# Patient Record
Sex: Female | Born: 1985
Health system: Southern US, Community
[De-identification: ages and names within clinical notes are randomized; demographics above are authoritative.]

## PROBLEM LIST (undated history)

## (undated) ENCOUNTER — Inpatient Hospital Stay (HOSPITAL_COMMUNITY): Payer: Self-pay

## (undated) DIAGNOSIS — N809 Endometriosis, unspecified: Secondary | ICD-10-CM

## (undated) DIAGNOSIS — N83209 Unspecified ovarian cyst, unspecified side: Secondary | ICD-10-CM

## (undated) DIAGNOSIS — O139 Gestational [pregnancy-induced] hypertension without significant proteinuria, unspecified trimester: Secondary | ICD-10-CM

## (undated) DIAGNOSIS — T8859XA Other complications of anesthesia, initial encounter: Secondary | ICD-10-CM

## (undated) HISTORY — DX: Unspecified ovarian cyst, unspecified side: N83.209

## (undated) HISTORY — PX: ABDOMINAL HYSTERECTOMY: SHX81

## (undated) HISTORY — DX: Endometriosis, unspecified: N80.9

---
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
## 2006-02-16 HISTORY — PX: LAPAROSCOPY: SHX197

## 2009-02-16 DIAGNOSIS — O139 Gestational [pregnancy-induced] hypertension without significant proteinuria, unspecified trimester: Secondary | ICD-10-CM

## 2009-02-16 HISTORY — DX: Gestational (pregnancy-induced) hypertension without significant proteinuria, unspecified trimester: O13.9

## 2013-02-16 NOTE — L&D Delivery Note (Signed)
Delivery Note At 3:20 PM a viable female was delivered via NSVD.  APGAR: and weight  .  pending Placenta status: Intact, Spontaneous.  Cord: 3 vessel with the following complications: none  Anesthesia:  epidural Episiotomy: none  Lacerations:  none Suture Repair: n/a Est. Blood Loss (mL):    Mom to postpartum.  Baby to Couplet care / Skin to Skin.  April Bolton C. 02/09/2014, 3:33 PM

## 2013-04-12 ENCOUNTER — Encounter: Payer: Self-pay | Admitting: Advanced Practice Midwife

## 2013-05-16 ENCOUNTER — Encounter: Payer: Self-pay | Admitting: Obstetrics & Gynecology

## 2013-05-16 ENCOUNTER — Ambulatory Visit (INDEPENDENT_AMBULATORY_CARE_PROVIDER_SITE_OTHER): Payer: 59 | Admitting: Obstetrics & Gynecology

## 2013-05-16 VITALS — BP 111/76 | HR 81 | Resp 16 | Ht 62.0 in | Wt 124.0 lb

## 2013-05-16 DIAGNOSIS — Z01419 Encounter for gynecological examination (general) (routine) without abnormal findings: Secondary | ICD-10-CM

## 2013-05-16 DIAGNOSIS — N809 Endometriosis, unspecified: Secondary | ICD-10-CM | POA: Insufficient documentation

## 2013-05-16 DIAGNOSIS — Z9889 Other specified postprocedural states: Secondary | ICD-10-CM

## 2013-05-16 DIAGNOSIS — Z1151 Encounter for screening for human papillomavirus (HPV): Secondary | ICD-10-CM

## 2013-05-16 DIAGNOSIS — Z8759 Personal history of other complications of pregnancy, childbirth and the puerperium: Secondary | ICD-10-CM | POA: Insufficient documentation

## 2013-05-16 DIAGNOSIS — Z124 Encounter for screening for malignant neoplasm of cervix: Secondary | ICD-10-CM

## 2013-05-16 NOTE — Progress Notes (Signed)
  Subjective:     April CoasterStephanie Blum is a 28 y.o. female here for a routine exam.  Current complaints: had a heavy mense last month.  Was trying to become pregnant but was told her pregnancy test was negative.  Personal health questionnaire reviewed: yes.   Gynecologic History Patient's last menstrual period was 03/14/2013. Contraception: none Last Pap: 2013. Results were: normal  Obstetric History OB History  Gravida Para Term Preterm AB SAB TAB Ectopic Multiple Living  1 1 1       1     # Outcome Date GA Lbr Len/2nd Weight Sex Delivery Anes PTL Lv  1 TRM 11/07/09 10133w0d            Comments: h/o ptl       The following portions of the patient's history were reviewed and updated as appropriate: allergies, current medications, past family history, past medical history, past social history, past surgical history and problem list.  Review of Systems Pertinent items are noted in HPI.    Objective:      Filed Vitals:   05/16/13 1448  BP: 111/76  Pulse: 81  Resp: 16  Height: 5\' 2"  (1.575 m)  Weight: 124 lb (56.246 kg)   Vitals:  WNL General appearance: alert, cooperative and no distress Head: Normocephalic, without obvious abnormality, atraumatic Eyes: negative Throat: lips, mucosa, and tongue normal; teeth and gums normal Lungs: clear to auscultation bilaterally Breasts: normal appearance, no masses or tenderness, No nipple retraction or dimpling, No nipple discharge or bleeding Heart: regular rate and rhythm Abdomen: soft, non-tender; bowel sounds normal; no masses,  no organomegaly  Pelvic:  External Genitalia:  Tanner V, no lesion Urethra:  No prolpase Vagina:  Pink, normal rugae, no blood or discharge Cervix:  No CMT, no lesion Uterus:  Normal size and contour, non tender Adnexa:  Normal, no masses, non tender  Extremities: no edema, redness or tenderness in the calves or thighs Skin: no lesions or rash Lymph nodes: Axillary adenopathy: none        Assessment:     Healthy female exam.    Plan:    Education reviewed: self breast exams and skin cancer screening. Contraception: none. Follow up in: 1 year. Prenatal vitamins Pap done.  Next pap is in 3 years.

## 2013-06-27 ENCOUNTER — Ambulatory Visit (INDEPENDENT_AMBULATORY_CARE_PROVIDER_SITE_OTHER): Payer: 59 | Admitting: Obstetrics & Gynecology

## 2013-06-27 ENCOUNTER — Encounter: Payer: Self-pay | Admitting: Obstetrics & Gynecology

## 2013-06-27 VITALS — BP 117/67 | HR 92 | Wt 126.0 lb

## 2013-06-27 DIAGNOSIS — Z3480 Encounter for supervision of other normal pregnancy, unspecified trimester: Secondary | ICD-10-CM

## 2013-06-27 NOTE — Progress Notes (Signed)
Initial prenatal visit, 2nd pregnancy, last PAP 04/2013 WNL. Pt has Hx of Preterm labor, pt started Procardia bid - PIH ypertension with last pregnancy and was induced at 39 weeks - Bedside u/s shows IUP 10.603mm GA 1870w1d with FHR 132bpm

## 2013-06-27 NOTE — Progress Notes (Signed)
   Subjective:    April CoasterStephanie Bolton is a G2P1001 7090w1d being seen today for her first obstetrical visit.  Her obstetrical history is significant for none. Patient does intend to breast feed. She breastfed her son for about 10 months.  Pregnancy history fully reviewed.  Patient reports nausea.  Filed Vitals:   06/27/13 1500  BP: 117/67  Pulse: 92  Weight: 126 lb (57.153 kg)    HISTORY: OB History  Gravida Para Term Preterm AB SAB TAB Ectopic Multiple Living  2 1 1       1     # Outcome Date GA Lbr Len/2nd Weight Sex Delivery Anes PTL Lv  2 CUR           1 TRM 11/07/09 1854w0d            Comments: h/o ptl     Past Medical History  Diagnosis Date  . Endometriosis   . Ovarian cyst   . Preterm labor    Past Surgical History  Procedure Laterality Date  . Laparoscopy  2008  . Appendectomy  2005  . Hernia repair  2005    inguinal  . Brachial cleft cyst  2006  . Wisdom tooth extraction  2006   Family History  Problem Relation Age of Onset  . Diabetes Father   . Heart disease Father   . Hypertension Father   . Cancer Mother     cervical     Exam    Uterus:     Pelvic Exam:    Perineum: No Hemorrhoids   Vulva: normal   Vagina:  normal mucosa   pH:    Cervix: anteverted   Adnexa: normal adnexa   Bony Pelvis: android  System: Breast:  normal appearance, no masses or tenderness   Skin: normal coloration and turgor, no rashes    Neurologic: oriented   Extremities: normal strength, tone, and muscle mass   HEENT PERRLA   Mouth/Teeth mucous membranes moist, pharynx normal without lesions   Neck supple   Cardiovascular: regular rate and rhythm   Respiratory:  appears well, vitals normal, no respiratory distress, acyanotic, normal RR, ear and throat exam is normal, neck free of mass or lymphadenopathy, chest clear, no wheezing, crepitations, rhonchi, normal symmetric air entry   Abdomen: soft, non-tender; bowel sounds normal; no masses,  no organomegaly   Urinary:  urethral meatus normal      Assessment:    Pregnancy: G2P1001 Patient Active Problem List   Diagnosis Date Noted  . Supervision of other normal pregnancy 06/27/2013  . Endometriosis 05/16/2013  . History of vacuum extraction assisted delivery 05/16/2013        Plan:     Initial labs drawn. Prenatal vitamins. Problem list reviewed and updated. Genetic Screening discussed Quad Screen: requested.  Ultrasound discussed; fetal survey: requested.  Follow up in 4 weeks. I recommended Vit B6 and Unisom prn.   Allie BossierMyra C Karcyn Menn 06/27/2013

## 2013-06-28 LAB — OBSTETRIC PANEL
Antibody Screen: NEGATIVE
Basophils Absolute: 0 10*3/uL (ref 0.0–0.1)
Basophils Relative: 0 % (ref 0–1)
EOS ABS: 0.2 10*3/uL (ref 0.0–0.7)
Eosinophils Relative: 2 % (ref 0–5)
HCT: 38.3 % (ref 36.0–46.0)
Hemoglobin: 12.7 g/dL (ref 12.0–15.0)
Hepatitis B Surface Ag: NEGATIVE
Lymphocytes Relative: 16 % (ref 12–46)
Lymphs Abs: 1.4 10*3/uL (ref 0.7–4.0)
MCH: 29.3 pg (ref 26.0–34.0)
MCHC: 33.2 g/dL (ref 30.0–36.0)
MCV: 88.5 fL (ref 78.0–100.0)
Monocytes Absolute: 0.6 10*3/uL (ref 0.1–1.0)
Monocytes Relative: 7 % (ref 3–12)
Neutro Abs: 6.7 10*3/uL (ref 1.7–7.7)
Neutrophils Relative %: 75 % (ref 43–77)
PLATELETS: 262 10*3/uL (ref 150–400)
RBC: 4.33 MIL/uL (ref 3.87–5.11)
RDW: 13.4 % (ref 11.5–15.5)
Rh Type: POSITIVE
Rubella: 0.85 Index (ref <0.90)
WBC: 8.9 10*3/uL (ref 4.0–10.5)

## 2013-06-28 LAB — GC/CHLAMYDIA PROBE AMP
CT PROBE, AMP APTIMA: NEGATIVE
GC PROBE AMP APTIMA: NEGATIVE

## 2013-06-28 LAB — HIV ANTIBODY (ROUTINE TESTING W REFLEX): HIV 1&2 Ab, 4th Generation: NONREACTIVE

## 2013-06-30 LAB — CULTURE, OB URINE: Colony Count: >=100000

## 2013-07-24 ENCOUNTER — Encounter: Payer: Self-pay | Admitting: Advanced Practice Midwife

## 2013-07-24 ENCOUNTER — Ambulatory Visit (INDEPENDENT_AMBULATORY_CARE_PROVIDER_SITE_OTHER): Payer: 59 | Admitting: Advanced Practice Midwife

## 2013-07-24 VITALS — BP 122/73 | HR 88 | Wt 127.0 lb

## 2013-07-24 DIAGNOSIS — Z3480 Encounter for supervision of other normal pregnancy, unspecified trimester: Secondary | ICD-10-CM

## 2013-07-24 NOTE — Progress Notes (Signed)
Doing well. Denies vaginal bleeding, LOF, cramping/contractions.  Nausea improved since last visit, not taking medications at this time.  Anticipatory guidance given about next prenatal visits/ultrasounds.

## 2013-08-07 ENCOUNTER — Inpatient Hospital Stay (HOSPITAL_COMMUNITY)
Admission: AD | Admit: 2013-08-07 | Discharge: 2013-08-07 | Disposition: A | Discharge status: Home / Self Care | Payer: 59 | Source: Ambulatory Visit | Attending: Obstetrics & Gynecology | Admitting: Obstetrics & Gynecology

## 2013-08-07 ENCOUNTER — Encounter (HOSPITAL_COMMUNITY): Payer: Self-pay | Admitting: *Deleted

## 2013-08-07 DIAGNOSIS — O210 Mild hyperemesis gravidarum: Secondary | ICD-10-CM | POA: Insufficient documentation

## 2013-08-07 DIAGNOSIS — O219 Vomiting of pregnancy, unspecified: Secondary | ICD-10-CM

## 2013-08-07 DIAGNOSIS — Z833 Family history of diabetes mellitus: Secondary | ICD-10-CM | POA: Insufficient documentation

## 2013-08-07 DIAGNOSIS — Z87891 Personal history of nicotine dependence: Secondary | ICD-10-CM | POA: Insufficient documentation

## 2013-08-07 LAB — CBC
HCT: 37.9 % (ref 36.0–46.0)
HEMOGLOBIN: 12.8 g/dL (ref 12.0–15.0)
MCH: 30.1 pg (ref 26.0–34.0)
MCHC: 33.8 g/dL (ref 30.0–36.0)
MCV: 89.2 fL (ref 78.0–100.0)
Platelets: 192 10*3/uL (ref 150–400)
RBC: 4.25 MIL/uL (ref 3.87–5.11)
RDW: 12.9 % (ref 11.5–15.5)
WBC: 7.3 10*3/uL (ref 4.0–10.5)

## 2013-08-07 LAB — COMPREHENSIVE METABOLIC PANEL
ALK PHOS: 47 U/L (ref 39–117)
ALT: 9 U/L (ref 0–35)
AST: 16 U/L (ref 0–37)
Albumin: 3.1 g/dL — ABNORMAL LOW (ref 3.5–5.2)
BILIRUBIN TOTAL: 0.2 mg/dL — AB (ref 0.3–1.2)
BUN: 9 mg/dL (ref 6–23)
CO2: 25 meq/L (ref 19–32)
Calcium: 8.8 mg/dL (ref 8.4–10.5)
Chloride: 99 mEq/L (ref 96–112)
Creatinine, Ser: 0.51 mg/dL (ref 0.50–1.10)
GFR calc Af Amer: >90 mL/min (ref >90)
GLUCOSE: 87 mg/dL (ref 70–99)
POTASSIUM: 4.1 meq/L (ref 3.7–5.3)
SODIUM: 135 meq/L — AB (ref 137–147)
Total Protein: 6.3 g/dL (ref 6.0–8.3)

## 2013-08-07 LAB — URINALYSIS, ROUTINE W REFLEX MICROSCOPIC
BILIRUBIN URINE: NEGATIVE
GLUCOSE, UA: NEGATIVE mg/dL
Hgb urine dipstick: NEGATIVE
KETONES UR: NEGATIVE mg/dL
Nitrite: NEGATIVE
PROTEIN: NEGATIVE mg/dL
Specific Gravity, Urine: 1.02 (ref 1.005–1.030)
Urobilinogen, UA: 0.2 mg/dL (ref 0.0–1.0)
pH: 7 (ref 5.0–8.0)

## 2013-08-07 LAB — URINE MICROSCOPIC-ADD ON

## 2013-08-07 MED ORDER — ONDANSETRON 8 MG PO TBDP
8.0000 mg | ORAL_TABLET | Freq: Once | ORAL | Status: AC
  Administered 2013-08-07: 8 mg via ORAL
  Filled 2013-08-07: qty 1

## 2013-08-07 MED ORDER — ONDANSETRON HCL 4 MG PO TABS: 4.0000 mg | ORAL_TABLET | Freq: Four times a day (QID) | ORAL | Status: DC

## 2013-08-07 NOTE — MAU Note (Signed)
Patient states she has sudden onset of vomiting last night. States she continues to have nausea and dry heaves and slight abdominal discomfort. Denies bleeding. Thinks it might be food poison.

## 2013-08-07 NOTE — MAU Provider Note (Signed)
Attestation of Attending Supervision of Advanced Practitioner (CNM/NP): Evaluation and management procedures were performed by the Advanced Practitioner under my supervision and collaboration. I have reviewed the Advanced Practitioner's note and chart, and I agree with the management and plan.  LEGGETT,KELLY H. 4:42 PM   

## 2013-08-07 NOTE — MAU Provider Note (Signed)
History     CSN: [id number]  Arrival date and time: 08/07/13 1303   First Provider Initiated Contact with Patient 08/07/13 1348      Chief Complaint  Patient presents with  . Emesis  . Nausea   HPI Comments: April Bolton 28 y.o. G2P1001 2725w0d presents to MAU with nausea and vomiting since last night. She ate sushi from [employer] and felt bad after that. She is unable to keep anything down. She had no nausea medications and called the office. She was told to come to MAU. +FHT. She has taken zofran in this pregnancy. She denies any fever but has had some chills. No other family members ill.  Emesis  Associated symptoms include chills.      Past Medical History  Diagnosis Date  . Endometriosis   . Ovarian cyst   . Preterm labor     Past Surgical History  Procedure Laterality Date  . Laparoscopy  2008  . Appendectomy  2005  . Hernia repair  2005    inguinal  . Brachial cleft cyst  2006  . Wisdom tooth extraction  2006    Family History  Problem Relation Age of Onset  . Diabetes Father   . Heart disease Father   . Hypertension Father   . Cancer Mother     cervical    History  Substance Use Topics  . Smoking status: Former Smoker -- 0.30 packs/day for 4 years    Types: Cigarettes  . Smokeless tobacco: Never Used  . Alcohol Use: No     Comment: Not since Pregnancy    Allergies:  Allergies  Allergen Reactions  . Brethine [Terbutaline] Other (See Comments)    Syncope and BP bottoms out  . Sulfa Antibiotics Rash    Prescriptions prior to admission  Medication Sig Dispense Refill  . Prenatal Vit-Fe Fumarate-FA (MULTIVITAMIN-PRENATAL) 27-0.8 MG TABS tablet Take 1 tablet by mouth daily at 12 noon.        Review of Systems  Constitutional: Positive for chills.  HENT: Negative.   Respiratory: Negative.   Cardiovascular: Negative.   Gastrointestinal: Positive for nausea and vomiting.  Genitourinary: Negative.   Musculoskeletal: Negative.   Skin:  Negative.   Neurological: Negative.   Psychiatric/Behavioral: Negative.    Physical Exam   Blood pressure 111/79, pulse 89, temperature 98.9 F (37.2 C), temperature source Oral, resp. rate 16, height 5' 2.5" (1.588 m), weight 57.97 kg (127 lb 12.8 oz), last menstrual period 05/01/2013, SpO2 99.00%.  Physical Exam  Constitutional: She is oriented to person, place, and time. She appears well-developed and well-nourished. No distress.  HENT:  Head: Normocephalic and atraumatic.  Eyes: Pupils are equal, round, and reactive to light.  Neck: Normal range of motion. Neck supple.  Cardiovascular: Normal rate, regular rhythm and normal heart sounds.   Respiratory: Effort normal and breath sounds normal. No respiratory distress. She has no wheezes.  GI: Soft. Bowel sounds are normal. She exhibits no distension. There is no tenderness. There is no rebound and no guarding.  Genitourinary:  Not examined  Musculoskeletal: Normal range of motion.  Neurological: She is alert and oriented to person, place, and time.  Skin: Skin is warm and dry.  Psychiatric: She has a normal mood and affect. Her behavior is normal. Judgment and thought content normal.   Results for orders placed during the hospital encounter of 08/07/13 (from the past 24 hour(s))  URINALYSIS, ROUTINE W REFLEX MICROSCOPIC     Status: Abnormal  Collection Time    08/07/13  1:20 PM      Result Value Ref Range   Color, Urine YELLOW  YELLOW   APPearance HAZY (*) CLEAR   Specific Gravity, Urine 1.020  1.005 - 1.030   pH 7.0  5.0 - 8.0   Glucose, UA NEGATIVE  NEGATIVE mg/dL   Hgb urine dipstick NEGATIVE  NEGATIVE   Bilirubin Urine NEGATIVE  NEGATIVE   Ketones, ur NEGATIVE  NEGATIVE mg/dL   Protein, ur NEGATIVE  NEGATIVE mg/dL   Urobilinogen, UA 0.2  0.0 - 1.0 mg/dL   Nitrite NEGATIVE  NEGATIVE   Leukocytes, UA SMALL (*) NEGATIVE  URINE MICROSCOPIC-ADD ON     Status: Abnormal   Collection Time    08/07/13  1:20 PM      Result  Value Ref Range   Squamous Epithelial / LPF FEW (*) RARE   WBC, UA 3-6  <3 WBC/hpf   RBC / HPF 0-2  <3 RBC/hpf   Bacteria, UA FEW (*) RARE   Urine-Other AMORPHOUS URATES/PHOSPHATES    CBC     Status: None   Collection Time    08/07/13  2:40 PM      Result Value Ref Range   WBC 7.3  4.0 - 10.5 K/uL   RBC 4.25  3.87 - 5.11 MIL/uL   Hemoglobin 12.8  12.0 - 15.0 g/dL   HCT 65.737.9  84.636.0 - 96.246.0 %   MCV 89.2  78.0 - 100.0 fL   MCH 30.1  26.0 - 34.0 pg   MCHC 33.8  30.0 - 36.0 g/dL   RDW 95.212.9  84.111.5 - 32.415.5 %   Platelets 192  150 - 400 K/uL  COMPREHENSIVE METABOLIC PANEL     Status: Abnormal   Collection Time    08/07/13  2:40 PM      Result Value Ref Range   Sodium 135 (*) 137 - 147 mEq/L   Potassium 4.1  3.7 - 5.3 mEq/L   Chloride 99  96 - 112 mEq/L   CO2 25  19 - 32 mEq/L   Glucose, Bld 87  70 - 99 mg/dL   BUN 9  6 - 23 mg/dL   Creatinine, Ser 4.010.51  0.50 - 1.10 mg/dL   Calcium 8.8  8.4 - 02.710.5 mg/dL   Total Protein 6.3  6.0 - 8.3 g/dL   Albumin 3.1 (*) 3.5 - 5.2 g/dL   AST 16  0 - 37 U/L   ALT 9  0 - 35 U/L   Alkaline Phosphatase 47  39 - 117 U/L   Total Bilirubin 0.2 (*) 0.3 - 1.2 mg/dL   GFR calc non Af Amer >90  >90 mL/min   GFR calc Af Amer >90  >90 mL/min    MAU Course  Procedures  MDM Zofran 8 mg ODT CBC, CMET, urine culture  Assessment and Plan   A: Nausea and vomiting  P: Zofran 8 mg ODT here and home Bland diet Note for work 2 days Follow up with [place] office  Barefoot, Rubbie BattiestLinda Miller 08/07/2013, 2:00 PM

## 2013-08-07 NOTE — Discharge Instructions (Signed)
Morning Sickness Morning sickness is when you feel sick to your stomach (nauseous) during pregnancy. This nauseous feeling may or may not come with vomiting. It often occurs in the morning but can be a problem any time of day. Morning sickness is most common during the first trimester, but it may continue throughout pregnancy. While morning sickness is unpleasant, it is usually harmless unless you develop severe and continual vomiting (hyperemesis gravidarum). This condition requires more intense treatment.  CAUSES  The cause of morning sickness is not completely known but seems to be related to normal hormonal changes that occur in pregnancy. RISK FACTORS You are at greater risk if you:  Experienced nausea or vomiting before your pregnancy.  Had morning sickness during a previous pregnancy.  Are pregnant with more than one baby, such as twins. TREATMENT  Do not use any medicines (prescription, over-the-counter, or herbal) for morning sickness without first talking to your health care provider. Your health care provider may prescribe or recommend:  Vitamin B6 supplements.  Anti-nausea medicines.  The herbal medicine ginger. HOME CARE INSTRUCTIONS   Only take over-the-counter or prescription medicines as directed by your health care provider.  Taking multivitamins before getting pregnant can prevent or decrease the severity of morning sickness in most women.  Eat a piece of dry toast or unsalted crackers before getting out of bed in the morning.  Eat five or six small meals a day.  Eat dry and bland foods (rice, baked potato). Foods high in carbohydrates are often helpful.  Do not drink liquids with your meals. Drink liquids between meals.  Avoid greasy, fatty, and spicy foods.  Get someone to cook for you if the smell of any food causes nausea and vomiting.  If you feel nauseous after taking prenatal vitamins, take the vitamins at night or with a snack.  Snack on protein  foods (nuts, yogurt, cheese) between meals if you are hungry.  Eat unsweetened gelatins for desserts.  Wearing an acupressure wristband (worn for sea sickness) may be helpful.  Acupuncture may be helpful.  Do not smoke.  Get a humidifier to keep the air in your house free of odors.  Get plenty of fresh air. SEEK MEDICAL CARE IF:   Your home remedies are not working, and you need medicine.  You feel dizzy or lightheaded.  You are losing weight. SEEK IMMEDIATE MEDICAL CARE IF:   You have persistent and uncontrolled nausea and vomiting.  You pass out (faint). MAKE SURE YOU:  Understand these instructions.  Will watch your condition.  Will get help right away if you are not doing well or get worse. Document Released: 03/26/2006 Document Revised: 02/07/2013 Document Reviewed: 07/20/2012 [facility] Patient Information 2015 [place], [place]. This information is not intended to replace advice given to you by your health care provider. Make sure you discuss any questions you have with your health care provider. Food Choices to Help Relieve Diarrhea When you have diarrhea, the foods you eat and your eating habits are very important. Choosing the right foods and drinks can help relieve diarrhea. Also, because diarrhea can last up to 7 days, you need to replace lost fluids and electrolytes (such as sodium, potassium, and chloride) in order to help prevent dehydration.  WHAT GENERAL GUIDELINES DO I NEED TO FOLLOW?  Slowly drink 1 cup (8 oz) of fluid for each episode of diarrhea. If you are getting enough fluid, your urine will be clear or pale yellow.  Eat starchy foods. Some good choices include  white rice, white toast, pasta, low-fiber cereal, baked potatoes (without the skin), saltine crackers, and bagels.  Avoid large servings of any cooked vegetables.  Limit fruit to two servings per day. A serving is  cup or 1 small piece.  Choose foods with less than 2 g of fiber per  serving.  Limit fats to less than 8 tsp (38 g) per day.  Avoid fried foods.  Eat foods that have probiotics in them. Probiotics can be found in certain dairy products.  Avoid foods and beverages that may increase the speed at which food moves through the stomach and intestines (gastrointestinal tract). Things to avoid include:  High-fiber foods, such as dried fruit, raw fruits and vegetables, nuts, seeds, and whole grain foods.  Spicy foods and high-fat foods.  Foods and beverages sweetened with high-fructose corn syrup, honey, or sugar alcohols such as xylitol, sorbitol, and mannitol. WHAT FOODS ARE RECOMMENDED? Grains White rice. White, [place], or pita breads (fresh or toasted), including plain rolls, buns, or bagels. White pasta. Saltine, soda, or graham crackers. Pretzels. Low-fiber cereal. Cooked cereals made with water (such as cornmeal, farina, or cream cereals). Plain muffins. Matzo. Melba toast. Zwieback.  Vegetables Potatoes (without the skin). Strained tomato and vegetable juices. Most well-cooked and canned vegetables without seeds. Tender lettuce. Fruits Cooked or canned applesauce, apricots, cherries, fruit cocktail, grapefruit, peaches, pears, or plums. Fresh bananas, apples without skin, cherries, grapes, cantaloupe, grapefruit, peaches, oranges, or plums.  Meat and Other Protein Products Baked or boiled chicken. Eggs. Tofu. Fish. Seafood. Smooth peanut butter. Ground or well-cooked tender beef, ham, veal, lamb, pork, or poultry.  Dairy Plain yogurt, kefir, and unsweetened liquid yogurt. Lactose-free milk, buttermilk, or soy milk. Plain hard cheese. Beverages Sport drinks. Clear broths. Diluted fruit juices (except prune). Regular, caffeine-free sodas such as ginger ale. Water. Decaffeinated teas. Oral rehydration solutions. Sugar-free beverages not sweetened with sugar alcohols. Other Bouillon, broth, or soups made from recommended foods.  The items listed above may not  be a complete list of recommended foods or beverages. Contact your dietitian for more options. WHAT FOODS ARE NOT RECOMMENDED? Grains Whole grain, whole wheat, bran, or rye breads, rolls, pastas, crackers, and cereals. Wild or brown rice. Cereals that contain more than 2 g of fiber per serving. Corn tortillas or taco shells. Cooked or dry oatmeal. Granola. Popcorn. Vegetables Raw vegetables. Cabbage, broccoli, Brussels sprouts, artichokes, baked beans, beet greens, corn, kale, legumes, peas, sweet potatoes, and yams. Potato skins. Cooked spinach and cabbage. Fruits Dried fruit, including raisins and dates. Raw fruits. Stewed or dried prunes. Fresh apples with skin, apricots, mangoes, pears, raspberries, and strawberries.  Meat and Other Protein Products Chunky peanut butter. Nuts and seeds. Beans and lentils. Tomasa BlaseBacon.  Dairy High-fat cheeses. Milk, chocolate milk, and beverages made with milk, such as milk shakes. Cream. Ice cream. Sweets and Desserts Sweet rolls, doughnuts, and sweet breads. Pancakes and waffles. Fats and Oils Butter. Cream sauces. Margarine. Salad oils. Plain salad dressings. Olives. Avocados.  Beverages Caffeinated beverages (such as coffee, tea, soda, or energy drinks). Alcoholic beverages. Fruit juices with pulp. Prune juice. Soft drinks sweetened with high-fructose corn syrup or sugar alcohols. Other Coconut. Hot sauce. Chili powder. Mayonnaise. Gravy. Cream-based or milk-based soups.  The items listed above may not be a complete list of foods and beverages to avoid. Contact your dietitian for more information. WHAT SHOULD I DO IF I BECOME DEHYDRATED? Diarrhea can sometimes lead to dehydration. Signs of dehydration include dark urine and dry mouth  and skin. If you think you are dehydrated, you should rehydrate with an oral rehydration solution. These solutions can be purchased at pharmacies, retail stores, or online.  Drink -1 cup (120-240 mL) of oral rehydration solution  each time you have an episode of diarrhea. If drinking this amount makes your diarrhea worse, try drinking smaller amounts more often. For example, drink 1-3 tsp (5-15 mL) every 5-10 minutes.  A general rule for staying hydrated is to drink 1-2 L of fluid per day. Talk to your health care provider about the specific amount you should be drinking each day. Drink enough fluids to keep your urine clear or pale yellow. Document Released: 04/25/2003 Document Revised: 02/07/2013 Document Reviewed: 12/26/2012 [facility]Exit[facility] Patient Information 2015 [place], [place]. This information is not intended to replace advice given to you by your health care provider. Make sure you discuss any questions you have with your health care provider.

## 2013-08-07 NOTE — MAU Note (Signed)
FHR dopplered @ 160; c/o ? food poisoning; ate sushi yesterday and woke-up last night vomiting; denies any diarrhea; no other family members are sick; afebrile;

## 2013-08-08 LAB — CULTURE, OB URINE
CULTURE: NO GROWTH
Colony Count: NO GROWTH

## 2013-08-11 ENCOUNTER — Telehealth (HOSPITAL_COMMUNITY): Payer: Self-pay

## 2013-08-21 ENCOUNTER — Encounter: Payer: Self-pay | Admitting: Family

## 2013-08-21 ENCOUNTER — Ambulatory Visit (INDEPENDENT_AMBULATORY_CARE_PROVIDER_SITE_OTHER): Payer: 59 | Admitting: Family

## 2013-08-21 VITALS — BP 109/70 | HR 95 | Wt 128.0 lb

## 2013-08-21 DIAGNOSIS — Z283 Underimmunization status: Secondary | ICD-10-CM

## 2013-08-21 DIAGNOSIS — Z2839 Other underimmunization status: Secondary | ICD-10-CM | POA: Insufficient documentation

## 2013-08-21 DIAGNOSIS — Z3480 Encounter for supervision of other normal pregnancy, unspecified trimester: Secondary | ICD-10-CM

## 2013-08-21 DIAGNOSIS — O09899 Supervision of other high risk pregnancies, unspecified trimester: Secondary | ICD-10-CM | POA: Insufficient documentation

## 2013-08-21 DIAGNOSIS — Z3481 Encounter for supervision of other normal pregnancy, first trimester: Secondary | ICD-10-CM

## 2013-08-21 DIAGNOSIS — O9989 Other specified diseases and conditions complicating pregnancy, childbirth and the puerperium: Secondary | ICD-10-CM

## 2013-08-21 DIAGNOSIS — O361190 Maternal care for Anti-A sensitization, unspecified trimester, not applicable or unspecified: Secondary | ICD-10-CM

## 2013-08-21 NOTE — Progress Notes (Signed)
Reviewed OB lab results.  Discussed rubella non-immune status and need for vaccination after delivery.  Scheduled OB anatomy ultrasound.  Return for quad screen in two weeks.

## 2013-09-07 ENCOUNTER — Other Ambulatory Visit (INDEPENDENT_AMBULATORY_CARE_PROVIDER_SITE_OTHER): Payer: 59 | Admitting: *Deleted

## 2013-09-07 DIAGNOSIS — R350 Frequency of micturition: Secondary | ICD-10-CM

## 2013-09-07 LAB — POCT URINALYSIS DIPSTICK
Bilirubin, UA: NEGATIVE
Blood, UA: NEGATIVE
Glucose, UA: NEGATIVE
KETONES UA: NEGATIVE
Nitrite, UA: NEGATIVE
PH UA: 6.5
Protein, UA: NEGATIVE
Urobilinogen, UA: NEGATIVE

## 2013-09-07 NOTE — Progress Notes (Signed)
Pt here today with c/o's lower back pain and urinary frequency.  Urine dip was neg but culture sent.  Encourage pt to use her belly band also.  If culture is negative pt will make appt for exam.

## 2013-09-09 LAB — CULTURE, URINE COMPREHENSIVE
Colony Count: NO GROWTH
Organism ID, Bacteria: NO GROWTH

## 2013-09-11 ENCOUNTER — Inpatient Hospital Stay (HOSPITAL_COMMUNITY)
Admission: AD | Admit: 2013-09-11 | Discharge: 2013-09-11 | Disposition: A | Discharge status: Home / Self Care | Payer: 59 | Source: Ambulatory Visit | Attending: Obstetrics & Gynecology | Admitting: Obstetrics & Gynecology

## 2013-09-11 ENCOUNTER — Inpatient Hospital Stay (HOSPITAL_COMMUNITY): Payer: 59

## 2013-09-11 ENCOUNTER — Encounter (HOSPITAL_COMMUNITY): Payer: Self-pay | Admitting: *Deleted

## 2013-09-11 DIAGNOSIS — Z87891 Personal history of nicotine dependence: Secondary | ICD-10-CM | POA: Insufficient documentation

## 2013-09-11 DIAGNOSIS — N898 Other specified noninflammatory disorders of vagina: Secondary | ICD-10-CM | POA: Diagnosis present

## 2013-09-11 DIAGNOSIS — O99891 Other specified diseases and conditions complicating pregnancy: Secondary | ICD-10-CM | POA: Diagnosis not present

## 2013-09-11 DIAGNOSIS — R109 Unspecified abdominal pain: Secondary | ICD-10-CM | POA: Diagnosis not present

## 2013-09-11 DIAGNOSIS — O4692 Antepartum hemorrhage, unspecified, second trimester: Secondary | ICD-10-CM

## 2013-09-11 DIAGNOSIS — O4690 Antepartum hemorrhage, unspecified, unspecified trimester: Secondary | ICD-10-CM

## 2013-09-11 DIAGNOSIS — O9989 Other specified diseases and conditions complicating pregnancy, childbirth and the puerperium: Principal | ICD-10-CM

## 2013-09-11 LAB — URINE MICROSCOPIC-ADD ON

## 2013-09-11 LAB — URINALYSIS, ROUTINE W REFLEX MICROSCOPIC
BILIRUBIN URINE: NEGATIVE
GLUCOSE, UA: NEGATIVE mg/dL
KETONES UR: NEGATIVE mg/dL
Nitrite: NEGATIVE
PH: 7 (ref 5.0–8.0)
Protein, ur: NEGATIVE mg/dL
Urobilinogen, UA: 0.2 mg/dL (ref 0.0–1.0)

## 2013-09-11 LAB — WET PREP, GENITAL
Clue Cells Wet Prep HPF POC: NONE SEEN
TRICH WET PREP: NONE SEEN
Yeast Wet Prep HPF POC: NONE SEEN

## 2013-09-11 NOTE — MAU Note (Signed)
Patient waiting in lobby for u/s and results; ok [facility] Provider [facility].

## 2013-09-11 NOTE — Discharge Instructions (Signed)
Your ultrasound shows normal fluid, closed cervix and normal placenta. We are not sure of the reason for your spotting today. Take tylenol as needed for discomfort. Return for heavy bleeding, severe pain, persistent vomiting, fever or other problems.

## 2013-09-11 NOTE — MAU Note (Signed)
Noted brownish d/c this morning. Was more of it and darker now, noted a small clot. Has been having some cramping in lower abd this morning.

## 2013-09-11 NOTE — [facility] Provider Note (Signed)
CSN: [id number]     Arrival date & time 09/11/13  1207 History   None    Chief Complaint  Patient presents with  . Vaginal Discharge     (Consider location/radiation/quality/duration/timing/severity/associated sxs/prior Treatment) Patient is a 28 y.o. female presenting with vaginal discharge. The history is provided by the patient.  Vaginal Discharge This is a new problem. The current episode started today. The problem occurs intermittently. Associated symptoms include abdominal pain. Pertinent negatives include no chills, congestion, coughing, fever, nausea, rash, sore throat, swollen glands, urinary symptoms or vomiting. Nothing aggravates the symptoms. She has tried nothing for the symptoms.   April Bolton is a 28 y.o. G2P1001 @ 4860w0d gestation who presents to the MAU with vaginal discharge and lower abdominal cramping. She describes the discharge as brown and a small amount. She describes the abdominal pain as mild that radiates to the lower back. Last sexual intercourse one week ago without bleeding or pain.   Past Medical History  Diagnosis Date  . Endometriosis   . Ovarian cyst   . Preterm labor    Past Surgical History  Procedure Laterality Date  . Laparoscopy  2008  . Appendectomy  2005  . Hernia repair  2005    inguinal  . Brachial cleft cyst  2006  . Wisdom tooth extraction  2006   Family History  Problem Relation Age of Onset  . Diabetes Father   . Heart disease Father   . Hypertension Father   . Cancer Mother     cervical   History  Substance Use Topics  . Smoking status: Former Smoker -- 0.30 packs/day for 4 years    Types: Cigarettes  . Smokeless tobacco: Never Used  . Alcohol Use: No     Comment: Not since Pregnancy   OB History   Grav Para Term Preterm Abortions TAB SAB Ect Mult Living   2 1 1       1      Review of Systems  Constitutional: Negative for fever and chills.  HENT: Negative for congestion and sore throat.   Respiratory: Negative  for cough.   Gastrointestinal: Positive for abdominal pain. Negative for nausea and vomiting.  Genitourinary: Positive for vaginal discharge.  Skin: Negative for rash.  Psychiatric/Behavioral: Negative for confusion. The patient is not nervous/anxious.       Allergies  Brethine and Sulfa antibiotics  Home Medications   Prior to Admission medications   Medication Sig Start Date End Date Taking? Authorizing Provider  cetirizine (ZYRTEC) 10 MG tablet Take 10 mg by mouth daily.    Historical Provider, MD  ondansetron (ZOFRAN) 4 MG tablet Take 1 tablet (4 mg total) by mouth every 6 (six) hours. 08/07/13   Delbert PhenixLinda M Barefoot, NP  Prenatal Vit-Fe Fumarate-FA (MULTIVITAMIN-PRENATAL) 27-0.8 MG TABS tablet Take 1 tablet by mouth daily at 12 noon.    Historical Provider, MD   BP 114/57  Pulse 81  Temp(Src) 98.4 F (36.9 C) (Oral)  Resp 18  Ht 5\' 1"  (1.549 m)  Wt 135 lb (61.236 kg)  BMI 25.52 kg/m2  LMP 05/01/2013 Physical Exam  Nursing note and vitals reviewed. Constitutional: She is oriented to person, place, and time. She appears well-developed and well-nourished.  HENT:  Head: Normocephalic.  Eyes: EOM are normal.  Neck: Neck supple.  Cardiovascular: Normal rate.   Pulmonary/Chest: Effort normal.  Abdominal: Soft. There is no tenderness.  Gravid, consistent with dates. +FHT  Genitourinary:  External genitalia without lesions, scant brown discharge vaginal  vault. Cervix closed, thick and high. No CMT, uterus consistent with dates.   Musculoskeletal: Normal range of motion.  Neurological: She is alert and oriented to person, place, and time. No cranial nerve deficit.  Skin: Skin is warm and dry.  Psychiatric: She has a normal mood and affect. Her behavior is normal.    ED Course  Procedures  Results for orders placed during the hospital encounter of 09/11/13 (from the past 24 hour(s))  URINALYSIS, ROUTINE W REFLEX MICROSCOPIC     Status: Abnormal   Collection Time    09/11/13  12:35 PM      Result Value Ref Range   Color, Urine YELLOW  YELLOW   APPearance CLEAR  CLEAR   Specific Gravity, Urine <1.005 (*) 1.005 - 1.030   pH 7.0  5.0 - 8.0   Glucose, UA NEGATIVE  NEGATIVE mg/dL   Hgb urine dipstick MODERATE (*) NEGATIVE   Bilirubin Urine NEGATIVE  NEGATIVE   Ketones, ur NEGATIVE  NEGATIVE mg/dL   Protein, ur NEGATIVE  NEGATIVE mg/dL   Urobilinogen, UA 0.2  0.0 - 1.0 mg/dL   Nitrite NEGATIVE  NEGATIVE   Leukocytes, UA MODERATE (*) NEGATIVE  URINE MICROSCOPIC-ADD ON     Status: Abnormal   Collection Time    09/11/13 12:35 PM      Result Value Ref Range   Squamous Epithelial / LPF FEW (*) RARE   WBC, UA 0-2  <3 WBC/hpf   RBC / HPF 0-2  <3 RBC/hpf  WET PREP, GENITAL     Status: Abnormal   Collection Time    09/11/13  1:40 PM      Result Value Ref Range   Yeast Wet Prep HPF POC NONE SEEN  NONE SEEN   Trich, Wet Prep NONE SEEN  NONE SEEN   Clue Cells Wet Prep HPF POC NONE SEEN  NONE SEEN   WBC, Wet Prep HPF POC MANY (*) NONE SEEN    Limited OB ultrasound shows normal cervical length, normal fluid, no previa.   MDM: Discussed with Dr. Debroah Loop. Will d/c home on pelvic rest and she will follow up as scheduled with [facility] office.   28 y.o. female with spotting and cramping. Stable for discharge with no active bleeding. She will take tylenol as needed for discomfort and drink plenty of fluids. She will return for worsening symptoms. Discussed with the patient and all questioned fully answered.    Medication List    ASK your doctor about these medications       cetirizine 10 MG tablet  Commonly known as:  ZYRTEC  Take 10 mg by mouth daily.     multivitamin-prenatal 27-0.8 MG Tabs tablet  Take 1 tablet by mouth daily at 12 noon.     ondansetron 4 MG tablet  Commonly known as:  ZOFRAN  Take 1 tablet (4 mg total) by mouth every 6 (six) hours.

## 2013-09-12 LAB — GC/CHLAMYDIA PROBE AMP
CT PROBE, AMP APTIMA: NEGATIVE
GC Probe RNA: NEGATIVE

## 2013-09-19 ENCOUNTER — Encounter: Payer: Self-pay | Admitting: Family

## 2013-09-19 ENCOUNTER — Ambulatory Visit (HOSPITAL_COMMUNITY)
Admission: RE | Admit: 2013-09-19 | Discharge: 2013-09-19 | Disposition: A | Discharge status: Home / Self Care | Payer: 59 | Source: Ambulatory Visit | Attending: Family | Admitting: Family

## 2013-09-19 ENCOUNTER — Other Ambulatory Visit: Payer: Self-pay | Admitting: Family

## 2013-09-19 DIAGNOSIS — Z8269 Family history of other diseases of the musculoskeletal system and connective tissue: Secondary | ICD-10-CM | POA: Diagnosis not present

## 2013-09-19 DIAGNOSIS — Z3481 Encounter for supervision of other normal pregnancy, first trimester: Secondary | ICD-10-CM

## 2013-09-19 DIAGNOSIS — IMO0002 Reserved for concepts with insufficient information to code with codable children: Secondary | ICD-10-CM | POA: Insufficient documentation

## 2013-09-19 DIAGNOSIS — O358XX0 Maternal care for other (suspected) fetal abnormality and damage, not applicable or unspecified: Secondary | ICD-10-CM | POA: Diagnosis not present

## 2013-09-19 DIAGNOSIS — Z3689 Encounter for other specified antenatal screening: Secondary | ICD-10-CM | POA: Diagnosis present

## 2013-09-19 LAB — QUAD SCREEN FOR MFM

## 2013-09-19 NOTE — Progress Notes (Signed)
Genetic Counseling  High-Risk Gestation Note  Appointment Date:  09/19/2013 Referred By: Marlis EdelsonKarim, Walidah N, CNM Date of Birth:  04/29/1985    Pregnancy History: G2P1001 Estimated Date of Delivery: 02/12/14 Estimated Gestational Age: 7416w1d Attending: Eulis FosterKristen Quinn, MD   Mrs. Almira CoasterStephanie Gleaves and her husband were seen for genetic counseling because of abnormal ultrasound findings.    [facility] Willette Almailton  [facility] detailed [facility].  Ultrasound revealed bilateral choroid plexus cysts.  The ultrasound report will be sent under separate cover.    We discussed that the second trimester genetic sonogram is targeted at identifying features associated with aneuploidy.  It has evolved as a screening tool used to provide an individualized risk assessment for Down syndrome and other trisomies.  The ability of sonography to aid in the detection of aneuploidies relies on identification of both major structural anomalies and "soft markers."  The patient was counseled that the latter term refers to findings that are often normal variants and do not cause any significant medical problems.  Nonetheless, these markers have a known association with aneuploidy.    Mrs. Willette Almailton was counseled that the choroid plexus is an area in the brain where cerebral spinal fluid, the fluid that bathes the brain and spinal cord, is made.  Cysts, or fluid filled sacs, are sometimes found in the choroid plexus of babies both before and after they are born.  We discussed that approximately 1% of pregnancies evaluated by ultrasound will show choroid plexus cyst (CPCs).  Literature suggests that CPCs are an ultrasound finding in approximately 30-50% of fetuses with trisomy 18, but are an isolated finding in less than 10% of fetuses with trisomy 3918.  Ms. Almira CoasterStephanie Jiron was counseled that when a patient has other risk factors for fetal trisomy 8318 (abnormal First trimester or quad screening,  advanced maternal age, or another ultrasound finding), CPCs are associated with an increased risk (LR of 9) for trisomy 6018.  Newer literature suggests that in the absence of other risk factors, CPCs are likely a normal variation of development or a benign finding.  CPCs are not associated with an increased risk for fetal Down syndrome.  We reviewed chromosomes, nondisjunction, and the common features and poor prognosis of trisomy 418.  We also reviewed Mrs. Brostrom's age related risk of ~1 in 61147 for fetal trisomy 2218.  Considering her maternal age of 28 y.o. and her otherwise normal fetal anatomy ultrasound, Ms. Firmin's risk for fetal trisomy 18 is not expected to be increased above her age related risk.  However, additional testing options for detection of fetal trisomy 8618 were discussed.  In addition, we reviewed the availability of Quad screen and noninvasive prenatal screening (NIPS)/cell free DNA testing.  We reviewed the benefits and limitations of these screening methods, including the conditions for which each screens, detection rates, and false positive rates of each. In addition, we discussed the option of diagnostic testing via amniocentesis.  We reviewed the approximate 1 in 300-500 risk for complications, including spontaneous pregnancy loss. After consideration of all the options, she elected to proceed with Quad screen today [facility]. She declined NIPS and amniocentesis today but stated that she will consider NIPS pending results of Quad screen.   Given the nature of today's discussion, cystic fibrosis carrier screening was not discussed. This is available to the patient if desired and if not previously performed.   Both family histories were reviewed and found to be  contributory for a maternal great aunt (maternal grandmother's sister) to the patient with intellectual disability. She reportedly lived with relatives her entire life but was able to work. She  has passed away, and an etiology is not known for her intellectual disability. Mrs. Solarz was counseled that there are many different causes of intellectual disabilities including environmental, multifactorial, and genetic etiologies.  We discussed that a specific diagnosis for intellectual disability can be determined in approximately 50% of these individuals.  In the remaining 50% of individuals, a diagnosis may never be determined.  Regarding genetic causes, we discussed that chromosome aberrations (aneuploidy, deletions, duplications, insertions, and translocations) are responsible for a small percentage of individuals with intellectual disability.  Many individuals with chromosome aberrations have additional differences, including congenital anomalies or minor dysmorphisms.  Likewise, single gene conditions are the underlying cause of intellectual delay in some families.  We discussed that many gene conditions have intellectual disability as a feature, but also often include other physical or medical differences.  Specifically, we reviewed fragile X syndrome and the X-linked inheritance of this condition.  We discussed the option of FMR1 (the gene that when altered causes fragile X syndrome) analysis to determine whether Fragile X syndrome is the cause of intellectual disability in this family.  Ms. Simerly declined FMR1 analysis today. We discussed that without more specific information, it is difficult to provide an accurate risk assessment.  Further genetic counseling is warranted if more information is obtained.  Mrs. Gavriella Hearst reported that her father and her paternal grandmother have a type of muscular dystrophy. She could not recall the specific name but stated that she thought it possibly started with an "A." She reported that her father has had progressive weakness affected his arms and has difficulty raising his arms above his head for lengthy time frames. She planned to inquire regarding the  type of muscular dystrophy from her mother. She reported that she does not currently have features of muscular dystrophy. There are many types of muscular dystrophy and muscle weakness conditions which can be acquired, sporadic or inherited in a number of ways. Based on the reported family history, we discussed a possible autosomal dominant form, but reviewed that other patterns of inheritance can occur for inherited forms of muscular dystrophies.  Additional information is needed in order to accurately assess the risk to the patient and her pregnancy. Without further information regarding the provided family history, an accurate genetic risk cannot be calculated. Further genetic counseling is warranted if more information is obtained.  Mrs. Agripina Guyette denied exposure to environmental toxins or chemical agents. She reported bleeding in the pregnancy last Monday and some this week. She denied the use of alcohol, tobacco or street drugs. She denied significant viral illnesses during the course of her pregnancy. Her medical and surgical histories were noncontributory.   I counseled this couple regarding the above risks and available options.  The approximate face-to-face time with the genetic counselor was 35 minutes.   Quinn Plowman, MS Certified Genetic Counselor 09/19/2013

## 2013-09-22 ENCOUNTER — Telehealth (HOSPITAL_COMMUNITY): Payer: Self-pay | Admitting: MS"

## 2013-09-22 NOTE — Telephone Encounter (Signed)
Called Ms. Almira CoasterStephanie Scherzinger regarding results of Quad screen [facility]. Patient identified by name and DOB. Reviewed that these are within normal limits, reducing the risk for fetal Trisomy 18 to less than 1 in 10,000. Additionally, we discussed the reduced risks for fetal Down syndrome (1 in 5,228) and open neural tube defects. We reviewed the detection rates for Trisomy 18 is approximately 60-65%. She understands that this screen does not diagnose or rule out these conditions but rather provides a pregnancy specific risk assessment.   Clydie BraunKaren Warnell Rasnic 09/22/2013 2:05 PM

## 2013-09-25 ENCOUNTER — Ambulatory Visit (INDEPENDENT_AMBULATORY_CARE_PROVIDER_SITE_OTHER): Payer: 59 | Admitting: Advanced Practice Midwife

## 2013-09-25 ENCOUNTER — Encounter: Payer: Self-pay | Admitting: *Deleted

## 2013-09-25 VITALS — BP 122/76 | HR 88 | Wt 135.0 lb

## 2013-09-25 DIAGNOSIS — N898 Other specified noninflammatory disorders of vagina: Secondary | ICD-10-CM

## 2013-09-25 DIAGNOSIS — Z3480 Encounter for supervision of other normal pregnancy, unspecified trimester: Secondary | ICD-10-CM

## 2013-09-25 DIAGNOSIS — Z3482 Encounter for supervision of other normal pregnancy, second trimester: Secondary | ICD-10-CM

## 2013-09-25 NOTE — Progress Notes (Signed)
Anatomy scan bilat CPS. Spine not seen. Neg quad. Saw Dentistgenetic counselor. F/U US scheduled. Spotting x 2.5 weeks. No recent IC. Rare BH. Cervix long and closed, friable. CL 4.87 per US. No previa. Bleeding precuations.

## 2013-09-25 NOTE — Progress Notes (Signed)
Been brown mucous spotting x 2 weeks.  Seen in [facility]

## 2013-09-25 NOTE — Patient Instructions (Signed)
Second Trimester of Pregnancy The second trimester is from week 13 through week 28, months 4 through 6. The second trimester is often a time when you feel your best. Your body has also adjusted to being pregnant, and you begin to feel better physically. Usually, morning sickness has lessened or quit completely, you may have more energy, and you may have an increase in appetite. The second trimester is also a time when the fetus is growing rapidly. At the end of the sixth month, the fetus is about 9 inches long and weighs about 1 pounds. You will likely begin to feel the baby move (quickening) between 18 and 20 weeks of the pregnancy. BODY CHANGES Your body goes through many changes during pregnancy. The changes vary from woman to woman.   Your weight will continue to increase. You will notice your lower abdomen bulging out.  You may begin to get stretch marks on your hips, abdomen, and breasts.  You may develop headaches that can be relieved by medicines approved by your health care provider.  You may urinate more often because the fetus is pressing on your bladder.  You may develop or continue to have heartburn as a result of your pregnancy.  You may develop constipation because certain hormones are causing the muscles that push waste through your intestines to slow down.  You may develop hemorrhoids or swollen, bulging veins (varicose veins).  You may have back pain because of the weight gain and pregnancy hormones relaxing your joints between the bones in your pelvis and as a result of a shift in weight and the muscles that support your balance.  Your breasts will continue to grow and be tender.  Your gums may bleed and may be sensitive to brushing and flossing.  Dark spots or blotches (chloasma, mask of pregnancy) may develop on your face. This will likely fade after the baby is born.  A dark line from your belly button to the pubic area (linea nigra) may appear. This will likely fade  after the baby is born.  You may have changes in your hair. These can include thickening of your hair, rapid growth, and changes in texture. Some women also have hair loss during or after pregnancy, or hair that feels dry or thin. Your hair will most likely return to normal after your baby is born. WHAT TO EXPECT AT YOUR PRENATAL VISITS During a routine prenatal visit:  You will be weighed to make sure you and the fetus are growing normally.  Your blood pressure will be taken.  Your abdomen will be measured to track your baby's growth.  The fetal heartbeat will be listened to.  Any test results from the previous visit will be discussed. Your health care provider may ask you:  How you are feeling.  If you are feeling the baby move.  If you have had any abnormal symptoms, such as leaking fluid, bleeding, severe headaches, or abdominal cramping.  If you have any questions. Other tests that may be performed during your second trimester include:  Blood tests that check for:  Low iron levels (anemia).  Gestational diabetes (between 24 and 28 weeks).  Rh antibodies.  Urine tests to check for infections, diabetes, or protein in the urine.  An ultrasound to confirm the proper growth and development of the baby.  An amniocentesis to check for possible genetic problems.  Fetal screens for spina bifida and Down syndrome. HOME CARE INSTRUCTIONS   Avoid all smoking, herbs, alcohol, and unprescribed   drugs. These chemicals affect the formation and growth of the baby.  Follow your health care provider's instructions regarding medicine use. There are medicines that are either safe or unsafe to take during pregnancy.  Exercise only as directed by your health care provider. Experiencing uterine cramps is a good sign to stop exercising.  Continue to eat regular, healthy meals.  Wear a good support bra for breast tenderness.  Do not use hot tubs, steam rooms, or saunas.  Wear your  seat belt at all times when driving.  Avoid raw meat, uncooked cheese, cat litter boxes, and soil used by cats. These carry germs that can cause birth defects in the baby.  Take your prenatal vitamins.  Try taking a stool softener (if your health care provider approves) if you develop constipation. Eat more high-fiber foods, such as fresh vegetables or fruit and whole grains. Drink plenty of fluids to keep your urine clear or pale yellow.  Take warm sitz baths to soothe any pain or discomfort caused by hemorrhoids. Use hemorrhoid cream if your health care provider approves.  If you develop varicose veins, wear support hose. Elevate your feet for 15 minutes, 3-4 times a day. Limit salt in your diet.  Avoid heavy lifting, wear low heel shoes, and practice good posture.  Rest with your legs elevated if you have leg cramps or low back pain.  Visit your dentist if you have not gone yet during your pregnancy. Use a soft toothbrush to brush your teeth and be gentle when you floss.  A sexual relationship may be continued unless your health care provider directs you otherwise.  Continue to go to all your prenatal visits as directed by your health care provider. SEEK MEDICAL CARE IF:   You have dizziness.  You have mild pelvic cramps, pelvic pressure, or nagging pain in the abdominal area.  You have persistent nausea, vomiting, or diarrhea.  You have a bad smelling vaginal discharge.  You have pain with urination. SEEK IMMEDIATE MEDICAL CARE IF:   You have a fever.  You are leaking fluid from your vagina.  You have spotting or bleeding from your vagina.  You have severe abdominal cramping or pain.  You have rapid weight gain or loss.  You have shortness of breath with chest pain.  You notice sudden or extreme swelling of your face, hands, ankles, feet, or legs.  You have not felt your baby move in over an hour.  You have severe headaches that do not go away with  medicine.  You have vision changes. Document Released: 01/27/2001 Document Revised: 02/07/2013 Document Reviewed: 04/05/2012 ExitCare Patient Information 2015 [employer]. This information is not intended to replace advice given to you by your health care provider. Make sure you discuss any questions you have with your health care provider.  Vaginal Bleeding During Pregnancy, Second Trimester A small amount of bleeding (spotting) from the vagina is relatively common in pregnancy. It usually stops on its own. Various things can cause bleeding or spotting in pregnancy. Some bleeding may be related to the pregnancy, and some may not. Sometimes the bleeding is normal and is not a problem. However, bleeding can also be a sign of something serious. Be sure to tell your health care provider about any vaginal bleeding right away. Some possible causes of vaginal bleeding during the second trimester include:  Infection, inflammation, or growths on the cervix.   The placenta may be partially or completely covering the opening of the cervix inside the   uterus (placenta previa).  The placenta may have separated from the uterus (abruption of the placenta).   You may be having early (preterm) labor.   The cervix may not be strong enough to keep a baby inside the uterus (cervical insufficiency).   Tiny cysts may have developed in the uterus instead of pregnancy tissue (molar pregnancy). HOME CARE INSTRUCTIONS  Watch your condition for any changes. The following actions may help to lessen any discomfort you are feeling:  Follow your health care provider's instructions for limiting your activity. If your health care provider orders bed rest, you may need to stay in bed and only get up to use the bathroom. However, your health care provider may allow you to continue light activity.  If needed, make plans for someone to help with your regular activities and responsibilities while you are on bed  rest.  Keep track of the number of pads you use each day, how often you change pads, and how soaked (saturated) they are. Write this down.  Do not use tampons. Do not douche.  Do not have sexual intercourse or orgasms until approved by your health care provider.  If you pass any tissue from your vagina, save the tissue so you can show it to your health care provider.  Only take over-the-counter or prescription medicines as directed by your health care provider.  Do not take aspirin because it can make you bleed.  Do not exercise or perform any strenuous activities or heavy lifting without your health care provider's permission.  Keep all follow-up appointments as directed by your health care provider. SEEK MEDICAL CARE IF:  You have any vaginal bleeding during any part of your pregnancy.  You have cramps or labor pains.  You have a fever, not controlled by medicine. SEEK IMMEDIATE MEDICAL CARE IF:   You have severe cramps in your back or belly (abdomen).  You have contractions.  You have chills.  You pass large clots or tissue from your vagina.  Your bleeding increases.  You feel light-headed or weak, or you have fainting episodes.  You are leaking fluid or have a gush of fluid from your vagina. MAKE SURE YOU:  Understand these instructions.  Will watch your condition.  Will get help right away if you are not doing well or get worse. Document Released: 11/12/2004 Document Revised: 02/07/2013 Document Reviewed: 10/10/2012 Exit[employer] Patient Information 2015 [employer]. This information is not intended to replace advice given to you by your health care provider. Make sure you discuss any questions you have with your health care provider.   

## 2013-09-26 LAB — WET PREP FOR TRICH, YEAST, CLUE
Clue Cells Wet Prep HPF POC: NONE SEEN
Trich, Wet Prep: NONE SEEN
Yeast Wet Prep HPF POC: NONE SEEN

## 2013-10-10 ENCOUNTER — Ambulatory Visit (INDEPENDENT_AMBULATORY_CARE_PROVIDER_SITE_OTHER): Payer: 59 | Admitting: Obstetrics & Gynecology

## 2013-10-10 VITALS — BP 112/63 | HR 88 | Wt 139.0 lb

## 2013-10-10 DIAGNOSIS — Z3480 Encounter for supervision of other normal pregnancy, unspecified trimester: Secondary | ICD-10-CM

## 2013-10-10 DIAGNOSIS — Z3482 Encounter for supervision of other normal pregnancy, second trimester: Secondary | ICD-10-CM

## 2013-10-10 DIAGNOSIS — O26892 Other specified pregnancy related conditions, second trimester: Secondary | ICD-10-CM

## 2013-10-10 DIAGNOSIS — N898 Other specified noninflammatory disorders of vagina: Secondary | ICD-10-CM

## 2013-10-10 DIAGNOSIS — O9989 Other specified diseases and conditions complicating pregnancy, childbirth and the puerperium: Secondary | ICD-10-CM

## 2013-10-10 NOTE — Progress Notes (Signed)
Pt having some mucous discharge.  NO bleeding.  Color of mucous is sometimes green or brown.  No contractions.  Pt has been told she has a friable cervix. Pt refuses GC/Chlam cultures.  Bd affirm done.  SSE:  Some yellow mucous, cervix has friable edges.  Closed internal os (ext os 1 cm), long, tone, posterior.    Pt reassured that cervix is long and closed.  Will f/u on cultures.  Pt to report further or worsening symptoms.   Pt has f/u anatomy US in 2 weeks and will have MFM look at her cervix at that time as well.

## 2013-10-10 NOTE — Progress Notes (Signed)
Concerned about a brownish d/c and slight cramping

## 2013-10-11 ENCOUNTER — Other Ambulatory Visit: Payer: Self-pay | Admitting: *Deleted

## 2013-10-11 ENCOUNTER — Other Ambulatory Visit (HOSPITAL_COMMUNITY): Payer: Self-pay | Admitting: Obstetrics and Gynecology

## 2013-10-11 DIAGNOSIS — Z3689 Encounter for other specified antenatal screening: Secondary | ICD-10-CM

## 2013-10-11 LAB — WET PREP BY MOLECULAR PROBE
CANDIDA SPECIES: NEGATIVE
Gardnerella vaginalis: NEGATIVE
Trichomonas vaginosis: NEGATIVE

## 2013-10-11 NOTE — Telephone Encounter (Signed)
LM on voicemail of neg wet prep 

## 2013-10-12 ENCOUNTER — Telehealth: Payer: Self-pay | Admitting: *Deleted

## 2013-10-12 NOTE — Telephone Encounter (Signed)
Message copied by Arne Cleveland on Thu Oct 12, 2013  3:44 PM ------      Message from: Lesly Dukes      Created: Thu Oct 12, 2013  3:28 PM       Wet prep is negative. ------

## 2013-10-12 NOTE — Telephone Encounter (Signed)
Called pt to adv wet prep nrml.

## 2013-10-13 ENCOUNTER — Inpatient Hospital Stay (HOSPITAL_COMMUNITY)
Admission: AD | Admit: 2013-10-13 | Discharge: 2013-10-13 | Disposition: A | Discharge status: Home / Self Care | Payer: 59 | Source: Ambulatory Visit | Attending: Obstetrics & Gynecology | Admitting: Obstetrics & Gynecology

## 2013-10-13 ENCOUNTER — Encounter (HOSPITAL_COMMUNITY): Payer: Self-pay | Admitting: *Deleted

## 2013-10-13 DIAGNOSIS — Z87891 Personal history of nicotine dependence: Secondary | ICD-10-CM | POA: Insufficient documentation

## 2013-10-13 DIAGNOSIS — R109 Unspecified abdominal pain: Secondary | ICD-10-CM | POA: Insufficient documentation

## 2013-10-13 DIAGNOSIS — N898 Other specified noninflammatory disorders of vagina: Secondary | ICD-10-CM | POA: Diagnosis not present

## 2013-10-13 DIAGNOSIS — O9989 Other specified diseases and conditions complicating pregnancy, childbirth and the puerperium: Principal | ICD-10-CM

## 2013-10-13 DIAGNOSIS — O99891 Other specified diseases and conditions complicating pregnancy: Secondary | ICD-10-CM | POA: Insufficient documentation

## 2013-10-13 DIAGNOSIS — O26892 Other specified pregnancy related conditions, second trimester: Secondary | ICD-10-CM

## 2013-10-13 LAB — URINALYSIS, ROUTINE W REFLEX MICROSCOPIC
Bilirubin Urine: NEGATIVE
GLUCOSE, UA: NEGATIVE mg/dL
HGB URINE DIPSTICK: NEGATIVE
Ketones, ur: 15 mg/dL — AB
LEUKOCYTES UA: NEGATIVE
Nitrite: NEGATIVE
PH: 7 (ref 5.0–8.0)
PROTEIN: NEGATIVE mg/dL
Specific Gravity, Urine: 1.01 (ref 1.005–1.030)
Urobilinogen, UA: 0.2 mg/dL (ref 0.0–1.0)

## 2013-10-13 LAB — WET PREP, GENITAL
Clue Cells Wet Prep HPF POC: NONE SEEN
Trich, Wet Prep: NONE SEEN
Yeast Wet Prep HPF POC: NONE SEEN

## 2013-10-13 NOTE — MAU Provider Note (Signed)
Attestation of Attending Supervision of Advanced Practitioner (CNM/NP): Evaluation and management procedures were performed by the Advanced Practitioner under my supervision and collaboration.  I have reviewed the Advanced Practitioner's note and chart, and I agree with the management and plan.  HARRAWAY-SMITH, Jaslyne Beeck 11:16 PM

## 2013-10-13 NOTE — MAU Provider Note (Signed)
First Provider Initiated Contact with Patient 10/13/13 1346      Chief Complaint:  Abdominal Pain and Vaginal Discharge   April Bolton is  28 y.o. G2P1001 at [redacted]w[redacted]d presents complaining of Abdominal Pain and Vaginal Discharge  She has been having a "sticky brown mucousy discharge" for the last month.  For the last 2 days it has been thin, brown/tan spotting.  She does note that 2 days ago she had 4-5 hours of cramping that resolved spontaneously prior to the change in discharge.  No foul smell or pruritus.  She's been told she has a friable cervix therefore she became nervous   She notes suprapubic pressure since this morning. While in the MAU she noted cramping in the abdomen bilaterally again that is intermittent in nature. Denies dysuria, urinary frequency, or urinary urgency. No fevers or chills.  It has been 2 weeks since sexual intercourse.  She's never noticed any bright red blood from the vagina. No loss of fluid. Good fetal movement  Obstetrical/Gynecological History: OB History   Grav Para Term Preterm Abortions TAB SAB Ect Mult Living   Past Medical History: Past Medical History  Diagnosis Date  . Endometriosis   . Ovarian cyst   . Preterm labor     Past Surgical History: Past Surgical History  Procedure Laterality Date  . Laparoscopy  2008  . Appendectomy  2005  . Hernia repair  2005    inguinal  . Brachial cleft cyst  2006  . Wisdom tooth extraction  2006    Family History: Family History  Problem Relation Age of Onset  . Diabetes Father   . Heart disease Father   . Hypertension Father   . Cancer Mother     cervical    Social History: History  Substance Use Topics  . Smoking status: Former Smoker -- 0.30 packs/day for 4 years    Types: Cigarettes  . Smokeless tobacco: Never Used  . Alcohol Use: No     Comment: Not since Pregnancy    Allergies:  Allergies  Allergen Reactions  . Brethine [Terbutaline] Other (See Comments)     Syncope and BP bottoms out  . Sulfa Antibiotics Rash    Meds:  Prescriptions prior to admission  Medication Sig Dispense Refill  . cetirizine (ZYRTEC) 10 MG tablet Take 10 mg by mouth daily.      . ondansetron (ZOFRAN) 4 MG tablet Take 1 tablet (4 mg total) by mouth every 6 (six) hours.  15 tablet  1  . Prenatal Vit-Fe Fumarate-FA (MULTIVITAMIN-PRENATAL) 27-0.8 MG TABS tablet Take 1 tablet by mouth daily at 12 noon.        Review of Systems -   Review of Systems  Constitutional: Negative for fever, chills, weight loss, malaise/fatigue and diaphoresis.  HENT: Negative for hearing loss, ear pain, nosebleeds, congestion, sore throat, neck pain, tinnitus and ear discharge.   Eyes: Negative for blurred vision, double vision, photophobia, pain, discharge and redness.  Respiratory: Negative for cough, hemoptysis, sputum production, shortness of breath, wheezing and stridor.   Cardiovascular: Negative for chest pain, palpitations, orthopnea,  leg swelling  Gastrointestinal: Negative for  heartburn, nausea, vomiting, diarrhea, constipation, blood in stool Genitourinary: Negative for dysuria, urgency, frequency, hematuria and flank pain.  Musculoskeletal: Negative for myalgias, back pain, joint pain and falls.  Skin: Negative for itching and rash.  Neurological: Negative for dizziness, tingling, tremors, sensory change, speech change,  focal weakness, seizures, loss of consciousness, weakness and headaches.  Endo/Heme/Allergies: Negative for environmental allergies and polydipsia. Does not bruise/bleed easily.  Psychiatric/Behavioral: Negative for depression, suicidal ideas, hallucinations, memory loss and substance abuse. The patient is not nervous/anxious and does not have insomnia.      Physical Exam  Blood pressure 111/68, pulse 87, temperature 98.7 F (37.1 C), temperature source Oral, resp. rate 20, last menstrual period 05/01/2013. GENERAL: Well-developed, well-nourished female in no  acute distress.  LUNGS: Clear to auscultation bilaterally.  HEART: Regular rate and rhythm. ABDOMEN: Soft, nontender, nondistended, gravid.  EXTREMITIES: Nontender, no edema, 2+ distal pulses. DTR's 2+ SPECULUM EXAM: Cervix closed, posterior, and friable. No blood in the cervical os or vaginal vault. Small amount of thin yellow discharge in the vaginal vault.  FHT:  Baseline rate 140 bpm   Variability minimal Accelerations: none but appropriate Decelerations none Contractions: None   Labs: Results for orders placed during the hospital encounter of 10/13/13 (from the past 24 hour(s))  URINALYSIS, ROUTINE W REFLEX MICROSCOPIC   Collection Time    10/13/13  1:22 PM      Result Value Ref Range   Color, Urine YELLOW  YELLOW   APPearance CLEAR  CLEAR   Specific Gravity, Urine 1.010  1.005 - 1.030   pH 7.0  5.0 - 8.0   Glucose, UA NEGATIVE  NEGATIVE mg/dL   Hgb urine dipstick NEGATIVE  NEGATIVE   Bilirubin Urine NEGATIVE  NEGATIVE   Ketones, ur 15 (*) NEGATIVE mg/dL   Protein, ur NEGATIVE  NEGATIVE mg/dL   Urobilinogen, UA 0.2  0.0 - 1.0 mg/dL   Nitrite NEGATIVE  NEGATIVE   Leukocytes, UA NEGATIVE  NEGATIVE   Imaging Studies:  US Ob Detail + 14 Wk  09/19/2013   OBSTETRICAL ULTRASOUND: This exam was performed within a [facility]. The OB US report was generated in the [facility] system, and faxed to the ordering physician.   This report is available in the [employer]. See the [facility] Obstetric US report via the Image Link.   Assessment: [name] is  28 y.o. G2P1001 at [redacted]w[redacted]d presents with yellow/tan vaginal discharge on exam and suprapubic pain. Wet prep negative and no blood noted on exam. Discharge does not appear pathologic in nature. U/A was negative for nitrites and leukocytes. No contractions noted on monitor.  Plan: - Discharge home - Advised to keep outpt appt - Return precautions given  [name] 8/28/20151:47 PM  OB fellow attestation:  I have  seen and examined this patient; I agree with above documentation in the resident's note.   [name] is a 28 y.o. G2P1001 reporting dark brown discharge +FM, denies LOF, VB, contractions  PE: BP 107/62  Pulse 84  Temp(Src) 98.7 F (37.1 C) (Oral)  Resp 20  LMP 05/01/2013 Gen: calm comfortable, NAD Resp: normal effort, no distress Abd: gravid Pelvis:  friable cervical os visualized with speculum, no blood in vault, white discharge in vault (examined with Dr. Leonides Schanz)  ROS, labs, PMH reviewed  Plan: - wet prep neg, no bleeding - f/u in clinic for routine care  Perry Mount, MD 10:52 PM

## 2013-10-13 NOTE — MAU Note (Signed)
Pt has been told by MD office she has friable cervix, had cramping for 4-5 hours 2 days ago.  Today has pelvic pressure, some cramping, also brownish bleeding.

## 2013-10-13 NOTE — Discharge Instructions (Signed)
Second Trimester of Pregnancy The second trimester is from week 13 through week 28, months 4 through 6. The second trimester is often a time when you feel your best. Your body has also adjusted to being pregnant, and you begin to feel better physically. Usually, morning sickness has lessened or quit completely, you may have more energy, and you may have an increase in appetite. The second trimester is also a time when the fetus is growing rapidly. At the end of the sixth month, the fetus is about 9 inches long and weighs about 1 pounds. You will likely begin to feel the baby move (quickening) between 18 and 20 weeks of the pregnancy. BODY CHANGES Your body goes through many changes during pregnancy. The changes vary from woman to woman.   Your weight will continue to increase. You will notice your lower abdomen bulging out.  You may begin to get stretch marks on your hips, abdomen, and breasts.  You may develop headaches that can be relieved by medicines approved by your health care provider.  You may urinate more often because the fetus is pressing on your bladder.  You may develop or continue to have heartburn as a result of your pregnancy.  You may develop constipation because certain hormones are causing the muscles that push waste through your intestines to slow down.  You may develop hemorrhoids or swollen, bulging veins (varicose veins).  You may have back pain because of the weight gain and pregnancy hormones relaxing your joints between the bones in your pelvis and as a result of a shift in weight and the muscles that support your balance.  Your breasts will continue to grow and be tender.  Your gums may bleed and may be sensitive to brushing and flossing.  Dark spots or blotches (chloasma, mask of pregnancy) may develop on your face. This will likely fade after the baby is born.  A dark line from your belly button to the pubic area (linea nigra) may appear. This will likely fade  after the baby is born.  You may have changes in your hair. These can include thickening of your hair, rapid growth, and changes in texture. Some women also have hair loss during or after pregnancy, or hair that feels dry or thin. Your hair will most likely return to normal after your baby is born. WHAT TO EXPECT AT YOUR PRENATAL VISITS During a routine prenatal visit:  You will be weighed to make sure you and the fetus are growing normally.  Your blood pressure will be taken.  Your abdomen will be measured to track your baby's growth.  The fetal heartbeat will be listened to.  Any test results from the previous visit will be discussed. Your health care provider may ask you:  How you are feeling.  If you are feeling the baby move.  If you have had any abnormal symptoms, such as leaking fluid, bleeding, severe headaches, or abdominal cramping.  If you have any questions. Other tests that may be performed during your second trimester include:  Blood tests that check for:  Low iron levels (anemia).  Gestational diabetes (between 24 and 28 weeks).  Rh antibodies.  Urine tests to check for infections, diabetes, or protein in the urine.  An ultrasound to confirm the proper growth and development of the baby.  An amniocentesis to check for possible genetic problems.  Fetal screens for spina bifida and Down syndrome. HOME CARE INSTRUCTIONS   Avoid all smoking, herbs, alcohol, and unprescribed   drugs. These chemicals affect the formation and growth of the baby.  Follow your health care provider's instructions regarding medicine use. There are medicines that are either safe or unsafe to take during pregnancy.  Exercise only as directed by your health care provider. Experiencing uterine cramps is a good sign to stop exercising.  Continue to eat regular, healthy meals.  Wear a good support bra for breast tenderness.  Do not use hot tubs, steam rooms, or saunas.  Wear your  seat belt at all times when driving.  Avoid raw meat, uncooked cheese, cat litter boxes, and soil used by cats. These carry germs that can cause birth defects in the baby.  Take your prenatal vitamins.  Try taking a stool softener (if your health care provider approves) if you develop constipation. Eat more high-fiber foods, such as fresh vegetables or fruit and whole grains. Drink plenty of fluids to keep your urine clear or pale yellow.  Take warm sitz baths to soothe any pain or discomfort caused by hemorrhoids. Use hemorrhoid cream if your health care provider approves.  If you develop varicose veins, wear support hose. Elevate your feet for 15 minutes, 3-4 times a day. Limit salt in your diet.  Avoid heavy lifting, wear low heel shoes, and practice good posture.  Rest with your legs elevated if you have leg cramps or low back pain.  Visit your dentist if you have not gone yet during your pregnancy. Use a soft toothbrush to brush your teeth and be gentle when you floss.  A sexual relationship may be continued unless your health care provider directs you otherwise.  Continue to go to all your prenatal visits as directed by your health care provider. SEEK MEDICAL CARE IF:   You have dizziness.  You have mild pelvic cramps, pelvic pressure, or nagging pain in the abdominal area.  You have persistent nausea, vomiting, or diarrhea.  You have a bad smelling vaginal discharge.  You have pain with urination. SEEK IMMEDIATE MEDICAL CARE IF:   You have a fever.  You are leaking fluid from your vagina.  You have spotting or bleeding from your vagina.  You have severe abdominal cramping or pain.  You have rapid weight gain or loss.  You have shortness of breath with chest pain.  You notice sudden or extreme swelling of your face, hands, ankles, feet, or legs.  You have not felt your baby move in over an hour.  You have severe headaches that do not go away with  medicine.  You have vision changes. Document Released: 01/27/2001 Document Revised: 02/07/2013 Document Reviewed: 04/05/2012 [employer] Patient Information 2015 [employer]. This information is not intended to replace advice given to you by your health care provider. Make sure you discuss any questions you have with your health care provider.  

## 2013-10-16 ENCOUNTER — Ambulatory Visit (HOSPITAL_COMMUNITY)
Admission: RE | Admit: 2013-10-16 | Discharge: 2013-10-16 | Disposition: A | Discharge status: Home / Self Care | Payer: 59 | Source: Ambulatory Visit | Attending: Obstetrics & Gynecology | Admitting: Obstetrics & Gynecology

## 2013-10-16 ENCOUNTER — Encounter (HOSPITAL_COMMUNITY): Payer: Self-pay

## 2013-10-16 VITALS — BP 115/53 | HR 79 | Wt 140.2 lb

## 2013-10-16 DIAGNOSIS — O358XX0 Maternal care for other (suspected) fetal abnormality and damage, not applicable or unspecified: Secondary | ICD-10-CM

## 2013-10-16 DIAGNOSIS — Z3689 Encounter for other specified antenatal screening: Secondary | ICD-10-CM | POA: Diagnosis not present

## 2013-10-16 DIAGNOSIS — O4690 Antepartum hemorrhage, unspecified, unspecified trimester: Secondary | ICD-10-CM | POA: Diagnosis not present

## 2013-10-16 DIAGNOSIS — O4692 Antepartum hemorrhage, unspecified, second trimester: Secondary | ICD-10-CM

## 2013-10-25 ENCOUNTER — Encounter: Payer: Self-pay | Admitting: Obstetrics & Gynecology

## 2013-10-25 ENCOUNTER — Ambulatory Visit (INDEPENDENT_AMBULATORY_CARE_PROVIDER_SITE_OTHER): Payer: 59 | Admitting: Obstetrics & Gynecology

## 2013-10-25 VITALS — BP 113/56 | HR 79 | Wt 142.0 lb

## 2013-10-25 DIAGNOSIS — Z3480 Encounter for supervision of other normal pregnancy, unspecified trimester: Secondary | ICD-10-CM

## 2013-10-25 DIAGNOSIS — Z3482 Encounter for supervision of other normal pregnancy, second trimester: Secondary | ICD-10-CM

## 2013-10-25 NOTE — Progress Notes (Signed)
Routine visit. Good FM. No problems except some hip pain. Exercises shown. She had a bad reaction to a flu vaccine in the past and declines one now. Glucola, TDAP, and labs at next visit.

## 2013-11-10 ENCOUNTER — Inpatient Hospital Stay (HOSPITAL_COMMUNITY)
Admission: AD | Admit: 2013-11-10 | Discharge: 2013-11-10 | Disposition: A | Discharge status: Home / Self Care | Payer: 59 | Source: Ambulatory Visit | Attending: Obstetrics & Gynecology | Admitting: Obstetrics & Gynecology

## 2013-11-10 ENCOUNTER — Encounter (HOSPITAL_COMMUNITY): Payer: Self-pay | Admitting: *Deleted

## 2013-11-10 DIAGNOSIS — O9989 Other specified diseases and conditions complicating pregnancy, childbirth and the puerperium: Principal | ICD-10-CM

## 2013-11-10 DIAGNOSIS — Z87891 Personal history of nicotine dependence: Secondary | ICD-10-CM | POA: Diagnosis not present

## 2013-11-10 DIAGNOSIS — R109 Unspecified abdominal pain: Secondary | ICD-10-CM | POA: Diagnosis present

## 2013-11-10 DIAGNOSIS — O99891 Other specified diseases and conditions complicating pregnancy: Secondary | ICD-10-CM | POA: Diagnosis not present

## 2013-11-10 DIAGNOSIS — O4700 False labor before 37 completed weeks of gestation, unspecified trimester: Secondary | ICD-10-CM | POA: Diagnosis not present

## 2013-11-10 DIAGNOSIS — O4702 False labor before 37 completed weeks of gestation, second trimester: Secondary | ICD-10-CM

## 2013-11-10 LAB — URINALYSIS, ROUTINE W REFLEX MICROSCOPIC
Bilirubin Urine: NEGATIVE
Glucose, UA: NEGATIVE mg/dL
Hgb urine dipstick: NEGATIVE
Ketones, ur: NEGATIVE mg/dL
NITRITE: NEGATIVE
Protein, ur: NEGATIVE mg/dL
SPECIFIC GRAVITY, URINE: 1.02 (ref 1.005–1.030)
UROBILINOGEN UA: 0.2 mg/dL (ref 0.0–1.0)
pH: 7 (ref 5.0–8.0)

## 2013-11-10 LAB — URINE MICROSCOPIC-ADD ON

## 2013-11-10 LAB — FETAL FIBRONECTIN: Fetal Fibronectin: POSITIVE — AB

## 2013-11-10 MED ORDER — NIFEDIPINE 10 MG PO CAPS
10.0000 mg | ORAL_CAPSULE | ORAL | Status: AC
  Administered 2013-11-10: 10 mg via ORAL
  Filled 2013-11-10: qty 1

## 2013-11-10 MED ORDER — BETAMETHASONE SOD PHOS & ACET 6 (3-3) MG/ML IJ SUSP
12.0000 mg | Freq: Once | INTRAMUSCULAR | Status: AC
  Administered 2013-11-10: 12 mg via INTRAMUSCULAR
  Filled 2013-11-10: qty 2

## 2013-11-10 MED ORDER — NIFEDIPINE 10 MG PO CAPS: 10.0000 mg | ORAL_CAPSULE | ORAL | Status: DC | PRN

## 2013-11-10 MED ORDER — BETAMETHASONE SOD PHOS & ACET 6 (3-3) MG/ML IJ SUSP: 12.0000 mg | Freq: Once | INTRAMUSCULAR | Status: DC

## 2013-11-10 MED ORDER — NIFEDIPINE 10 MG PO CAPS: 10.0000 mg | ORAL_CAPSULE | Freq: Four times a day (QID) | ORAL | Status: DC

## 2013-11-10 NOTE — MAU Provider Note (Signed)
Chief Complaint:  Contractions and Back Pain   First Provider Initiated Contact with Patient 11/10/13 1410      HPI: April Bolton is a 28 y.o. G2P1001 at 17w4dwho presents to maternity admissions reporting cramping and back pain with onset yesterday, worsening today. She reports she had a birthday party for her son 1 week ago and had some brown/reddish spotting x2-3 afterwards which resolved.  Then, yesterday, she felt nauseous, had trouble keeping down water to stay hydrated, and started having back pain and abdominal cramping.  She has hx of preterm contractions and was on procardia in her previous pregnancy but had a term delivery. She reports good fetal movement, denies LOF, vaginal itching/burning, urinary symptoms, h/a, dizziness, or fever/chills.  Past Medical History: Past Medical History  Diagnosis Date  . Endometriosis   . Ovarian cyst   . Preterm labor     Past obstetric history: OB History  Gravida Para Term Preterm AB SAB TAB Ectopic Multiple Living  # Outcome Date GA Lbr Len/2nd Weight Sex Delivery Anes PTL Lv  2 CUR           1 TRM 11/07/09 [redacted]w[redacted]d  3.544 kg (7 lb 13 oz) M VAC   Y     Comments: h/o ptl; GHTN, IOL 39 wks; 2nd degree      Past Surgical History: Past Surgical History  Procedure Laterality Date  . Laparoscopy  2008  . Appendectomy  2005  . Hernia repair  2005    inguinal  . Brachial cleft cyst  2006  . Wisdom tooth extraction  2006    Family History: Family History  Problem Relation Age of Onset  . Diabetes Father   . Heart disease Father   . Hypertension Father   . Cancer Mother     cervical    Social History: History  Substance Use Topics  . Smoking status: Former Smoker -- 0.30 packs/day for 4 years    Types: Cigarettes  . Smokeless tobacco: Never Used  . Alcohol Use: No     Comment: Not since Pregnancy    Allergies:  Allergies  Allergen Reactions  . Brethine [Terbutaline] Other (See Comments)    Syncope  and BP bottoms out  . Sulfa Antibiotics Rash    Meds:  Prescriptions prior to admission  Medication Sig Dispense Refill  . Prenatal Vit-Fe Fumarate-FA (PRENATAL MULTIVITAMIN) TABS tablet Take 1 tablet by mouth daily at 12 noon.        ROS: Pertinent findings in history of present illness.  Physical Exam  Blood pressure 121/66, pulse 100, temperature 98.5 F (36.9 C), temperature source Oral, resp. rate 18, height 5' 1.5" (1.562 m), weight 67.132 kg (148 lb), last menstrual period 05/01/2013. GENERAL: Well-developed, well-nourished female in no acute distress.  HEENT: normocephalic HEART: normal rate RESP: normal effort ABDOMEN: Soft, non-tender, gravid appropriate for gestational age EXTREMITIES: Nontender, no edema NEURO: alert and oriented SPECULUM EXAM: NEFG, physiologic discharge, no blood, cervix clean Dilation: Closed Effacement (%):  (longh) Exam by:: L. leftwhich-Kerby, CNM  FHT:  Baseline 145, moderate variability, accelerations present, no decelerations Contractions: irritability every 1-2 minutes lasting 30 seconds or less   Labs:  Results for orders placed during the hospital encounter of 11/10/13 (from the past 168 hour(s))  URINALYSIS, ROUTINE W REFLEX MICROSCOPIC   Collection Time    11/10/13  1:15 PM      Result Value Ref Range  Color, Urine YELLOW  YELLOW   APPearance CLEAR  CLEAR   Specific Gravity, Urine 1.020  1.005 - 1.030   pH 7.0  5.0 - 8.0   Glucose, UA NEGATIVE  NEGATIVE mg/dL   Hgb urine dipstick NEGATIVE  NEGATIVE   Bilirubin Urine NEGATIVE  NEGATIVE   Ketones, ur NEGATIVE  NEGATIVE mg/dL   Protein, ur NEGATIVE  NEGATIVE mg/dL   Urobilinogen, UA 0.2  0.0 - 1.0 mg/dL   Nitrite NEGATIVE  NEGATIVE   Leukocytes, UA SMALL (*) NEGATIVE  URINE MICROSCOPIC-ADD ON   Collection Time    11/10/13  1:15 PM      Result Value Ref Range   Squamous Epithelial / LPF FEW (*) RARE   WBC, UA 3-6  <3 WBC/hpf  FETAL FIBRONECTIN   Collection Time     11/10/13  2:15 PM      Result Value Ref Range   Fetal Fibronectin POSITIVE (*) NEGATIVE     Assessment: 1. Rubella non-immune status, antepartum   2. Encounter for supervision of other normal pregnancy in second trimester     Plan: Procardia 10 mg PO x 1 dose in MAU BMZ x 1 dose in MAU today Discharge home PTL precautions and fetal kick counts Return to MAU in 24 hours for second dose of BMZ Procardia Rx 10 mg Q 4-6 hours PRN F/U at [facility] Return to MAU as needed for emergencies    Medication List    ASK your doctor about these medications       prenatal multivitamin Tabs tablet  Take 1 tablet by mouth daily at 12 noon.        Sharen Counter Certified Nurse-Midwife 11/10/2013 2:18 PM

## 2013-11-10 NOTE — Discharge Instructions (Signed)

## 2013-11-10 NOTE — MAU Note (Signed)
Had some cramping over weekend, had some reddish mucous d/c.  Yesterday just felt bad, started having some contractions and back is hurting.  Started having diarrhea and vomiting this morning. Denies fever, no one at home with GI problems.  Hx of PTcontractions, was on procardia but delivered at term.

## 2013-11-11 ENCOUNTER — Inpatient Hospital Stay (HOSPITAL_COMMUNITY)
Admission: AD | Admit: 2013-11-11 | Discharge: 2013-11-11 | Disposition: A | Discharge status: Home / Self Care | Payer: 59 | Source: Ambulatory Visit | Attending: Obstetrics & Gynecology | Admitting: Obstetrics & Gynecology

## 2013-11-11 DIAGNOSIS — O4700 False labor before 37 completed weeks of gestation, unspecified trimester: Secondary | ICD-10-CM | POA: Diagnosis present

## 2013-11-11 MED ORDER — BETAMETHASONE SOD PHOS & ACET 6 (3-3) MG/ML IJ SUSP
12.0000 mg | Freq: Once | INTRAMUSCULAR | Status: AC
  Administered 2013-11-11: 12 mg via INTRAMUSCULAR
  Filled 2013-11-11: qty 2

## 2013-11-11 NOTE — MAU Note (Signed)
Pt presents to MAU for repeat betamethasone injection

## 2013-11-13 ENCOUNTER — Ambulatory Visit (INDEPENDENT_AMBULATORY_CARE_PROVIDER_SITE_OTHER): Payer: 59 | Admitting: Advanced Practice Midwife

## 2013-11-13 VITALS — BP 130/77 | HR 107 | Wt 146.0 lb

## 2013-11-13 DIAGNOSIS — Z3482 Encounter for supervision of other normal pregnancy, second trimester: Secondary | ICD-10-CM

## 2013-11-13 DIAGNOSIS — O4702 False labor before 37 completed weeks of gestation, second trimester: Secondary | ICD-10-CM

## 2013-11-13 DIAGNOSIS — O4700 False labor before 37 completed weeks of gestation, unspecified trimester: Secondary | ICD-10-CM | POA: Insufficient documentation

## 2013-11-13 DIAGNOSIS — O479 False labor, unspecified: Secondary | ICD-10-CM | POA: Insufficient documentation

## 2013-11-13 DIAGNOSIS — Z3480 Encounter for supervision of other normal pregnancy, unspecified trimester: Secondary | ICD-10-CM

## 2013-11-13 DIAGNOSIS — N898 Other specified noninflammatory disorders of vagina: Secondary | ICD-10-CM

## 2013-11-13 NOTE — Progress Notes (Signed)
Seen in MAU 9/25. Pos fFN. BMZ x 2. Contractions same to today. No cervical change. Work note given. Pelvic rest. 28 week labs and TDaP at [place].

## 2013-11-13 NOTE — Patient Instructions (Signed)

## 2013-11-13 NOTE — Assessment & Plan Note (Signed)
BMZ 9/25, 9/26

## 2013-11-14 LAB — WET PREP, GENITAL
CLUE CELLS WET PREP: NONE SEEN
Trich, Wet Prep: NONE SEEN
YEAST WET PREP: NONE SEEN

## 2013-11-15 ENCOUNTER — Telehealth: Payer: Self-pay | Admitting: *Deleted

## 2013-11-15 NOTE — Telephone Encounter (Signed)
Called pt to adv wet prep was normal - pt expressed understanding.

## 2013-11-20 ENCOUNTER — Ambulatory Visit (INDEPENDENT_AMBULATORY_CARE_PROVIDER_SITE_OTHER): Payer: 59 | Admitting: Advanced Practice Midwife

## 2013-11-20 VITALS — BP 126/81 | HR 108 | Wt 145.0 lb

## 2013-11-20 DIAGNOSIS — Z3482 Encounter for supervision of other normal pregnancy, second trimester: Secondary | ICD-10-CM

## 2013-11-20 DIAGNOSIS — Z3492 Encounter for supervision of normal pregnancy, unspecified, second trimester: Secondary | ICD-10-CM

## 2013-11-20 DIAGNOSIS — Z23 Encounter for immunization: Secondary | ICD-10-CM

## 2013-11-20 MED ORDER — TETANUS-DIPHTH-ACELL PERTUSSIS 5-2.5-18.5 LF-MCG/0.5 IM SUSP
0.5000 mL | Freq: Once | INTRAMUSCULAR | Status: AC
Start: 2013-11-20 — End: 2013-11-20
  Administered 2013-11-20: 0.5 mL via INTRAMUSCULAR

## 2013-11-20 NOTE — Patient Instructions (Signed)

## 2013-11-20 NOTE — Progress Notes (Signed)
Pt states that she is having the greenish vaginal d/c again.  Tdap given but refused FLU vaccine.

## 2013-11-20 NOTE — Progress Notes (Signed)
28 week labs. TDaP. Having sporadic contractions and some mucoid, slightly green discharge. No LOF or VB. US neg. No urinary complaints. No cervical change. PTL precautions.

## 2013-11-21 ENCOUNTER — Telehealth: Payer: Self-pay | Admitting: *Deleted

## 2013-11-21 LAB — CBC
HCT: 35.1 % — ABNORMAL LOW (ref 36.0–46.0)
Hemoglobin: 11.7 g/dL — ABNORMAL LOW (ref 12.0–15.0)
MCH: 29.3 pg (ref 26.0–34.0)
MCHC: 33.3 g/dL (ref 30.0–36.0)
MCV: 87.8 fL (ref 78.0–100.0)
PLATELETS: 221 10*3/uL (ref 150–400)
RBC: 4 MIL/uL (ref 3.87–5.11)
RDW: 13.5 % (ref 11.5–15.5)
WBC: 8.5 10*3/uL (ref 4.0–10.5)

## 2013-11-21 LAB — RPR

## 2013-11-21 LAB — GLUCOSE TOLERANCE, 1 HOUR (50G) W/O FASTING: Glucose, 1 Hour GTT: 135 mg/dL (ref 70–140)

## 2013-11-21 LAB — HIV ANTIBODY (ROUTINE TESTING W REFLEX): HIV: NONREACTIVE

## 2013-11-21 NOTE — Telephone Encounter (Signed)
Pt notified of abnormal 1 hr GTT and she will schedule a fasting 3 hr GTT tomorrow.

## 2013-11-22 ENCOUNTER — Other Ambulatory Visit: Payer: 59

## 2013-11-22 DIAGNOSIS — R7309 Other abnormal glucose: Secondary | ICD-10-CM

## 2013-11-23 ENCOUNTER — Encounter: Payer: Self-pay | Admitting: Advanced Practice Midwife

## 2013-11-23 ENCOUNTER — Telehealth: Payer: Self-pay | Admitting: *Deleted

## 2013-11-23 LAB — GLUCOSE TOLERANCE, 3 HOURS
GLUCOSE, 2 HOUR-GESTATIONAL: 122 mg/dL (ref 70–164)
Glucose Tolerance, 1 hour: 165 mg/dL (ref 70–189)
Glucose Tolerance, Fasting: 81 mg/dL (ref 70–104)
Glucose, GTT - 3 Hour: 91 mg/dL (ref 70–144)

## 2013-11-23 NOTE — Telephone Encounter (Signed)
Pt notified of normal 3 hr GTT. 

## 2013-12-04 ENCOUNTER — Ambulatory Visit (INDEPENDENT_AMBULATORY_CARE_PROVIDER_SITE_OTHER): Payer: 59 | Admitting: Obstetrics & Gynecology

## 2013-12-04 ENCOUNTER — Encounter: Payer: Self-pay | Admitting: Obstetrics & Gynecology

## 2013-12-04 VITALS — BP 115/72 | HR 89 | Wt 149.0 lb

## 2013-12-04 DIAGNOSIS — Z3482 Encounter for supervision of other normal pregnancy, second trimester: Secondary | ICD-10-CM

## 2013-12-04 NOTE — Progress Notes (Signed)
Routine visit. Good FM. No problems. She declines flu vaccine.

## 2013-12-14 NOTE — MAU Provider Note (Signed)
Attestation of Attending Supervision of Advanced Practitioner (CNM/NP): Evaluation and management procedures were performed by the Advanced Practitioner under my supervision and collaboration.  I have reviewed the Advanced Practitioner's note and chart, and I agree with the management and plan.  HARRAWAY-SMITH, Kailea Dannemiller 3:52 PM

## 2013-12-18 ENCOUNTER — Ambulatory Visit (INDEPENDENT_AMBULATORY_CARE_PROVIDER_SITE_OTHER): Payer: Self-pay | Admitting: Advanced Practice Midwife

## 2013-12-18 ENCOUNTER — Encounter: Payer: Self-pay | Admitting: Advanced Practice Midwife

## 2013-12-18 VITALS — BP 114/71 | HR 90 | Wt 152.0 lb

## 2013-12-18 DIAGNOSIS — Z3483 Encounter for supervision of other normal pregnancy, third trimester: Secondary | ICD-10-CM

## 2013-12-18 DIAGNOSIS — R1011 Right upper quadrant pain: Secondary | ICD-10-CM

## 2013-12-18 MED ORDER — PANTOPRAZOLE SODIUM 20 MG PO TBEC: 20.0000 mg | DELAYED_RELEASE_TABLET | Freq: Every day | ORAL | Status: DC

## 2013-12-18 NOTE — Patient Instructions (Signed)
Third Trimester of Pregnancy The third trimester is from week 29 through week 42, months 7 through 9. The third trimester is a time when the fetus is growing rapidly. At the end of the ninth month, the fetus is about 20 inches in length and weighs 6-10 pounds.  BODY CHANGES Your body goes through many changes during pregnancy. The changes vary from woman to woman.   Your weight will continue to increase. You can expect to gain 25-35 pounds (11-16 kg) by the end of the pregnancy.  You may begin to get stretch marks on your hips, abdomen, and breasts.  You may urinate more often because the fetus is moving lower into your pelvis and pressing on your bladder.  You may develop or continue to have heartburn as a result of your pregnancy.  You may develop constipation because certain hormones are causing the muscles that push waste through your intestines to slow down.  You may develop hemorrhoids or swollen, bulging veins (varicose veins).  You may have pelvic pain because of the weight gain and pregnancy hormones relaxing your joints between the bones in your pelvis. Backaches may result from overexertion of the muscles supporting your posture.  You may have changes in your hair. These can include thickening of your hair, rapid growth, and changes in texture. Some women also have hair loss during or after pregnancy, or hair that feels dry or thin. Your hair will most likely return to normal after your baby is born.  Your breasts will continue to grow and be tender. A yellow discharge may leak from your breasts called colostrum.  Your belly button may stick out.  You may feel short of breath because of your expanding uterus.  You may notice the fetus "dropping," or moving lower in your abdomen.  You may have a bloody mucus discharge. This usually occurs a few days to a week before labor begins.  Your cervix becomes thin and soft (effaced) near your due date. WHAT TO EXPECT AT YOUR PRENATAL  EXAMS  You will have prenatal exams every 2 weeks until week 36. Then, you will have weekly prenatal exams. During a routine prenatal visit:  You will be weighed to make sure you and the fetus are growing normally.  Your blood pressure is taken.  Your abdomen will be measured to track your baby's growth.  The fetal heartbeat will be listened to.  Any test results from the previous visit will be discussed.  You may have a cervical check near your due date to see if you have effaced. At around 36 weeks, your caregiver will check your cervix. At the same time, your caregiver will also perform a test on the secretions of the vaginal tissue. This test is to determine if a type of bacteria, Group B streptococcus, is present. Your caregiver will explain this further. Your caregiver may ask you:  What your birth plan is.  How you are feeling.  If you are feeling the baby move.  If you have had any abnormal symptoms, such as leaking fluid, bleeding, severe headaches, or abdominal cramping.  If you have any questions. Other tests or screenings that may be performed during your third trimester include:  Blood tests that check for low iron levels (anemia).  Fetal testing to check the health, activity level, and growth of the fetus. Testing is done if you have certain medical conditions or if there are problems during the pregnancy. FALSE LABOR You may feel small, irregular contractions that   eventually go away. These are called Braxton Hicks contractions, or false labor. Contractions may last for hours, days, or even weeks before true labor sets in. If contractions come at regular intervals, intensify, or become painful, it is best to be seen by your caregiver.  SIGNS OF LABOR   Menstrual-like cramps.  Contractions that are 5 minutes apart or less.  Contractions that start on the top of the uterus and spread down to the lower abdomen and back.  A sense of increased pelvic pressure or back  pain.  A watery or bloody mucus discharge that comes from the vagina. If you have any of these signs before the 37th week of pregnancy, call your caregiver right away. You need to go to the hospital to get checked immediately. HOME CARE INSTRUCTIONS   Avoid all smoking, herbs, alcohol, and unprescribed drugs. These chemicals affect the formation and growth of the baby.  Follow your caregiver's instructions regarding medicine use. There are medicines that are either safe or unsafe to take during pregnancy.  Exercise only as directed by your caregiver. Experiencing uterine cramps is a good sign to stop exercising.  Continue to eat regular, healthy meals.  Wear a good support bra for breast tenderness.  Do not use hot tubs, steam rooms, or saunas.  Wear your seat belt at all times when driving.  Avoid raw meat, uncooked cheese, cat litter boxes, and soil used by cats. These carry germs that can cause birth defects in the baby.  Take your prenatal vitamins.  Try taking a stool softener (if your caregiver approves) if you develop constipation. Eat more high-fiber foods, such as fresh vegetables or fruit and whole grains. Drink plenty of fluids to keep your urine clear or pale yellow.  Take warm sitz baths to soothe any pain or discomfort caused by hemorrhoids. Use hemorrhoid cream if your caregiver approves.  If you develop varicose veins, wear support hose. Elevate your feet for 15 minutes, 3-4 times a day. Limit salt in your diet.  Avoid heavy lifting, wear low heal shoes, and practice good posture.  Rest a lot with your legs elevated if you have leg cramps or low back pain.  Visit your dentist if you have not gone during your pregnancy. Use a soft toothbrush to brush your teeth and be gentle when you floss.  A sexual relationship may be continued unless your caregiver directs you otherwise.  Do not travel far distances unless it is absolutely necessary and only with the approval  of your caregiver.  Take prenatal classes to understand, practice, and ask questions about the labor and delivery.  Make a trial run to the hospital.  Pack your hospital bag.  Prepare the baby's nursery.  Continue to go to all your prenatal visits as directed by your caregiver. SEEK MEDICAL CARE IF:  You are unsure if you are in labor or if your water has broken.  You have dizziness.  You have mild pelvic cramps, pelvic pressure, or nagging pain in your abdominal area.  You have persistent nausea, vomiting, or diarrhea.  You have a bad smelling vaginal discharge.  You have pain with urination. SEEK IMMEDIATE MEDICAL CARE IF:   You have a fever.  You are leaking fluid from your vagina.  You have spotting or bleeding from your vagina.  You have severe abdominal cramping or pain.  You have rapid weight loss or gain.  You have shortness of breath with chest pain.  You notice sudden or extreme swelling   of your face, hands, ankles, feet, or legs.  You have not felt your baby move in over an hour.  You have severe headaches that do not go away with medicine.  You have vision changes. Document Released: 01/27/2001 Document Revised: 02/07/2013 Document Reviewed: 04/05/2012 ExitCare Patient Information 2015 [employer]. This information is not intended to replace advice given to you by your health care provider. Make sure you discuss any questions you have with your health care provider.  

## 2013-12-18 NOTE — Progress Notes (Signed)
Reviewed normal 3 hr GTT results.  Has some RUQ pain after eating. Discussed may be gallbladder or acid reflux.  No N/V with episodes. Will try some protonix. She will let us know if worsens or if N/V occur with pain, then will need to scan gallbladder.

## 2013-12-22 ENCOUNTER — Encounter (HOSPITAL_COMMUNITY): Payer: Self-pay

## 2013-12-22 ENCOUNTER — Inpatient Hospital Stay (HOSPITAL_COMMUNITY)
Admission: AD | Admit: 2013-12-22 | Discharge: 2013-12-23 | Disposition: A | Discharge status: Other Acute Inpatient Hospital | Payer: 59 | Source: Ambulatory Visit | Attending: Obstetrics & Gynecology | Admitting: Obstetrics & Gynecology

## 2013-12-22 DIAGNOSIS — Z283 Underimmunization status: Secondary | ICD-10-CM

## 2013-12-22 DIAGNOSIS — O9989 Other specified diseases and conditions complicating pregnancy, childbirth and the puerperium: Secondary | ICD-10-CM

## 2013-12-22 DIAGNOSIS — O4703 False labor before 37 completed weeks of gestation, third trimester: Secondary | ICD-10-CM

## 2013-12-22 DIAGNOSIS — O479 False labor, unspecified: Secondary | ICD-10-CM | POA: Diagnosis present

## 2013-12-22 DIAGNOSIS — Z3483 Encounter for supervision of other normal pregnancy, third trimester: Secondary | ICD-10-CM

## 2013-12-22 DIAGNOSIS — Z87891 Personal history of nicotine dependence: Secondary | ICD-10-CM | POA: Diagnosis not present

## 2013-12-22 DIAGNOSIS — Z2839 Other underimmunization status: Secondary | ICD-10-CM

## 2013-12-22 DIAGNOSIS — O4700 False labor before 37 completed weeks of gestation, unspecified trimester: Secondary | ICD-10-CM | POA: Diagnosis present

## 2013-12-22 DIAGNOSIS — O09899 Supervision of other high risk pregnancies, unspecified trimester: Secondary | ICD-10-CM

## 2013-12-22 DIAGNOSIS — Z3A32 32 weeks gestation of pregnancy: Secondary | ICD-10-CM | POA: Insufficient documentation

## 2013-12-22 LAB — URINALYSIS, ROUTINE W REFLEX MICROSCOPIC
BILIRUBIN URINE: NEGATIVE
Glucose, UA: NEGATIVE mg/dL
Hgb urine dipstick: NEGATIVE
Ketones, ur: NEGATIVE mg/dL
Leukocytes, UA: NEGATIVE
NITRITE: NEGATIVE
PH: 6 (ref 5.0–8.0)
Protein, ur: NEGATIVE mg/dL
Specific Gravity, Urine: 1.025 (ref 1.005–1.030)
Urobilinogen, UA: 1 mg/dL (ref 0.0–1.0)

## 2013-12-22 MED ORDER — LACTATED RINGERS IV BOLUS (SEPSIS)
1000.0000 mL | Freq: Once | INTRAVENOUS | Status: AC
  Administered 2013-12-22: 1000 mL via INTRAVENOUS

## 2013-12-22 MED ORDER — NIFEDIPINE 10 MG PO CAPS
10.0000 mg | ORAL_CAPSULE | Freq: Once | ORAL | Status: AC
  Administered 2013-12-22: 10 mg via ORAL
  Filled 2013-12-22: qty 1

## 2013-12-22 MED ORDER — NIFEDIPINE ER OSMOTIC RELEASE 30 MG PO TB24
30.0000 mg | ORAL_TABLET | Freq: Every day | ORAL | Status: DC
  Administered 2013-12-22: 30 mg via ORAL
  Filled 2013-12-22: qty 1

## 2013-12-22 MED ORDER — NIFEDIPINE 10 MG PO CAPS
20.0000 mg | ORAL_CAPSULE | Freq: Once | ORAL | Status: AC
  Administered 2013-12-22: 20 mg via ORAL
  Filled 2013-12-22: qty 2

## 2013-12-22 NOTE — MAU Provider Note (Signed)
History     CSN: [id number]  Arrival date and time: 12/22/13 [id number]   None     Chief Complaint  Patient presents with  . Contractions  . Emesis  . Diarrhea   HPI April Bolton is a 28 y.o. G2P1001 at 6648w4d who presents with lower abdominal pain and pressure since yesterday. Patient has a history of contractions, and took a procardia earlier today which helped alleviate her symptoms some, however they persisted so she presented to the MAU. Continues to feel pressure and cramping.  No LOF or vaginal bleeding. Good fetal movement.   terb allergy: reports episode of severe hypotension during last pregnancy and told to never take medication again  OB History    Gravida Para Term Preterm AB TAB SAB Ectopic Multiple Living   2 1 1       1       Past Medical History  Diagnosis Date  . Endometriosis   . Ovarian cyst   . Preterm labor     Past Surgical History  Procedure Laterality Date  . Laparoscopy  2008  . Appendectomy  2005  . Hernia repair  2005    inguinal  . Brachial cleft cyst  2006  . Wisdom tooth extraction  2006    Family History  Problem Relation Age of Onset  . Diabetes Father   . Heart disease Father   . Hypertension Father   . Cancer Mother     cervical    History  Substance Use Topics  . Smoking status: Former Smoker -- 0.30 packs/day for 4 years    Types: Cigarettes  . Smokeless tobacco: Never Used  . Alcohol Use: No     Comment: Not since Pregnancy    Allergies:  Allergies  Allergen Reactions  . Brethine [Terbutaline] Other (See Comments)    Syncope and BP bottoms out  . Sulfa Antibiotics Rash    Prescriptions prior to admission  Medication Sig Dispense Refill Last Dose  . NIFEdipine (PROCARDIA) 10 MG capsule Take 1 capsule (10 mg total) by mouth every 4 (four) hours as needed. 60 capsule 1 12/22/2013 at Unknown time  . pantoprazole (PROTONIX) 20 MG tablet Take 1 tablet (20 mg total) by mouth daily. 30 tablet 1 12/22/2013 at Unknown  time  . Prenatal Vit-Fe Fumarate-FA (PRENATAL MULTIVITAMIN) TABS tablet Take 1 tablet by mouth daily at 12 noon.   12/21/2013 at Unknown time    Review of Systems  All other systems reviewed and are negative.  Physical Exam   Blood pressure 113/70, pulse 105, temperature 99.2 F (37.3 C), temperature source Oral, resp. rate 18, last menstrual period 05/01/2013.  Physical Exam  Nursing note and vitals reviewed. Constitutional: She is oriented to person, place, and time. She appears well-developed and well-nourished. No distress.  HENT:  Head: Normocephalic and atraumatic.  Eyes: EOM are normal.  Neck: Normal range of motion.  Cardiovascular: Normal rate, regular rhythm and normal heart sounds.   No murmur heard. Respiratory: Effort normal and breath sounds normal. No respiratory distress. She has no wheezes.  GI: Soft. She exhibits no distension. There is no tenderness. There is no rebound and no guarding.  Musculoskeletal: Normal range of motion. She exhibits no edema.  Neurological: She is alert and oriented to person, place, and time. She has normal reflexes.  Skin: Skin is warm and dry.  FHT:  FHR: 145 bpm, variability: moderate,  accelerations:  present,  decelerations:  none UC: q2 REGULAR  Procedures  Assessment and Plan  A: 6031w4d IUP with preterm contractions   P: Given procardia 20mg  no improvement, 2nd dose procardia 10mg  no change, then given 1L, UA neg.  Discussed with Dr. Macon LargeAnyanwu, terbutaline considered however patient apparently has allergy to terb so was not given; procardia 30mg  XL given.  - Cervix rechecked after 3+ hours, still 1/thick/high however cervix now moderate consistency from firm. Patient also reporting that the contractions are getting stronger.  Discussed findings with patient and advised risk of preterm labor.  At this time NICU has no beds available and advise transfer; patient agreeable with plan requesting to also be transferred considering  that  neonate would be transferred in event of delivery.  Patient currently stable albeit q282min contractions and no signs of eminent delivery.  Will start magnesium for tocolysis given failure of procardia, indomethacin contraindicated given GA and terb contraindicated secondary to allergy.  No betamethasone for now as was given 9/25 & 9/26 at 5753w4d. Will initiate transfer progress.   April Bolton 12/22/2013, 11:50 PM    Attestation of Attending Supervision of Obstetric Fellow: Evaluation and management procedures were performed by the Obstetric Fellow under my supervision and collaboration.  I have reviewed the Obstetric Fellow's note and chart, and I agree with the management and plan.  Discussed patient with Dr. Bonnye FavaLawrence Hopkins (OB on call at [facility]) and he accepts transfer of this patient and confirms NICU availability.  Will transfer patient as soon as possible.     April CollinsUGONNA  ANYANWU, MD, FACOG Attending Obstetrician & Gynecologist Faculty Practice, [facility]

## 2013-12-22 NOTE — MAU Note (Signed)
Patient states she has lower abdominal pain and back pain that started yesterday.

## 2013-12-22 NOTE — MAU Note (Signed)
Yesterday, started having a lot of lower abd pain and pressure.  Today it has continued, started having back pain .  Has been nauseated, thrown up twice, had diarrhea.  Took a procardia at 3. But things seem to be picking back up again.

## 2013-12-23 MED ORDER — MAGNESIUM SULFATE BOLUS VIA INFUSION
6.0000 g | Freq: Once | INTRAVENOUS | Status: DC
  Filled 2013-12-23: qty 500

## 2013-12-23 MED ORDER — MAGNESIUM SULFATE 40 G IN LACTATED RINGERS - SIMPLE
2.0000 g/h | INTRAVENOUS | Status: DC
  Administered 2013-12-23: 2 g/h via INTRAVENOUS
  Filled 2013-12-23: qty 500

## 2013-12-23 MED ORDER — LACTATED RINGERS IV BOLUS (SEPSIS)
1000.0000 mL | INTRAVENOUS | Status: DC
  Administered 2013-12-23: 1000 mL via INTRAVENOUS

## 2013-12-23 NOTE — Progress Notes (Signed)
Pt transferred via [facility] for continuation of care.

## 2013-12-23 NOTE — MAU Note (Signed)
Report called to Amy Dwiggins RN [facility]. Rm assignment [id number]

## 2013-12-23 NOTE — Progress Notes (Signed)
Pt to be transferred to [facility] for PTL management due to [facility] NICU is unable to accept any admissions. Pt in agreement with plan.  Pt made aware that Magnesium sulfate will be started prior to transfer.

## 2013-12-25 LAB — URINE CULTURE

## 2013-12-26 ENCOUNTER — Encounter (HOSPITAL_COMMUNITY): Payer: Self-pay | Admitting: General Practice

## 2013-12-26 ENCOUNTER — Inpatient Hospital Stay (HOSPITAL_COMMUNITY)
Admission: AD | Admit: 2013-12-26 | Discharge: 2013-12-26 | Disposition: A | Discharge status: Home / Self Care | Payer: 59 | Source: Ambulatory Visit | Attending: Family Medicine | Admitting: Family Medicine

## 2013-12-26 ENCOUNTER — Ambulatory Visit (INDEPENDENT_AMBULATORY_CARE_PROVIDER_SITE_OTHER): Payer: Self-pay | Admitting: Obstetrics & Gynecology

## 2013-12-26 VITALS — BP 127/70 | HR 109 | Wt 159.0 lb

## 2013-12-26 DIAGNOSIS — O9989 Other specified diseases and conditions complicating pregnancy, childbirth and the puerperium: Secondary | ICD-10-CM

## 2013-12-26 DIAGNOSIS — O4703 False labor before 37 completed weeks of gestation, third trimester: Secondary | ICD-10-CM

## 2013-12-26 DIAGNOSIS — Z2839 Other underimmunization status: Secondary | ICD-10-CM

## 2013-12-26 DIAGNOSIS — Z3A33 33 weeks gestation of pregnancy: Secondary | ICD-10-CM | POA: Diagnosis not present

## 2013-12-26 DIAGNOSIS — Z283 Underimmunization status: Secondary | ICD-10-CM

## 2013-12-26 DIAGNOSIS — Z87891 Personal history of nicotine dependence: Secondary | ICD-10-CM | POA: Insufficient documentation

## 2013-12-26 DIAGNOSIS — Z789 Other specified health status: Secondary | ICD-10-CM | POA: Insufficient documentation

## 2013-12-26 DIAGNOSIS — Z3483 Encounter for supervision of other normal pregnancy, third trimester: Secondary | ICD-10-CM

## 2013-12-26 DIAGNOSIS — O09899 Supervision of other high risk pregnancies, unspecified trimester: Secondary | ICD-10-CM

## 2013-12-26 DIAGNOSIS — O26893 Other specified pregnancy related conditions, third trimester: Secondary | ICD-10-CM | POA: Insufficient documentation

## 2013-12-26 LAB — WET PREP, GENITAL
Clue Cells Wet Prep HPF POC: NONE SEEN
Trich, Wet Prep: NONE SEEN
Yeast Wet Prep HPF POC: NONE SEEN

## 2013-12-26 LAB — COMPREHENSIVE METABOLIC PANEL
ALT: 8 U/L (ref 0–35)
AST: 12 U/L (ref 0–37)
Albumin: 2.4 g/dL — ABNORMAL LOW (ref 3.5–5.2)
Alkaline Phosphatase: 70 U/L (ref 39–117)
Anion gap: 10 (ref 5–15)
BILIRUBIN TOTAL: 0.3 mg/dL (ref 0.3–1.2)
BUN: 7 mg/dL (ref 6–23)
CO2: 24 meq/L (ref 19–32)
CREATININE: 0.47 mg/dL — AB (ref 0.50–1.10)
Calcium: 8.1 mg/dL — ABNORMAL LOW (ref 8.4–10.5)
Chloride: 101 mEq/L (ref 96–112)
GFR calc Af Amer: >90 mL/min (ref >90)
GFR calc non Af Amer: >90 mL/min (ref >90)
Glucose, Bld: 92 mg/dL (ref 70–99)
Potassium: 3.7 mEq/L (ref 3.7–5.3)
SODIUM: 135 meq/L — AB (ref 137–147)
Total Protein: 5.1 g/dL — ABNORMAL LOW (ref 6.0–8.3)

## 2013-12-26 LAB — URINALYSIS, ROUTINE W REFLEX MICROSCOPIC
BILIRUBIN URINE: NEGATIVE
Glucose, UA: NEGATIVE mg/dL
HGB URINE DIPSTICK: NEGATIVE
Ketones, ur: 15 mg/dL — AB
Leukocytes, UA: NEGATIVE
Nitrite: NEGATIVE
Protein, ur: NEGATIVE mg/dL
SPECIFIC GRAVITY, URINE: 1.02 (ref 1.005–1.030)
UROBILINOGEN UA: 0.2 mg/dL (ref 0.0–1.0)
pH: 6 (ref 5.0–8.0)

## 2013-12-26 MED ORDER — LACTATED RINGERS IV BOLUS (SEPSIS)
1000.0000 mL | Freq: Once | INTRAVENOUS | Status: AC
  Administered 2013-12-26: 1000 mL via INTRAVENOUS

## 2013-12-26 MED ORDER — LACTATED RINGERS IV BOLUS (SEPSIS)
1000.0000 mL | Freq: Once | INTRAVENOUS | Status: DC
  Administered 2013-12-26: 1000 mL via INTRAVENOUS

## 2013-12-26 MED ORDER — DEXTROSE 5 % IN LACTATED RINGERS IV BOLUS
1000.0000 mL | Freq: Once | INTRAVENOUS | Status: AC
  Administered 2013-12-26: 1000 mL via INTRAVENOUS

## 2013-12-26 MED ORDER — NIFEDIPINE 10 MG PO CAPS
20.0000 mg | ORAL_CAPSULE | Freq: Once | ORAL | Status: AC
  Administered 2013-12-26: 20 mg via ORAL
  Filled 2013-12-26: qty 2

## 2013-12-26 NOTE — Progress Notes (Signed)
Seen in MAU twice in the last 2 weeks for preterm labor

## 2013-12-26 NOTE — MAU Provider Note (Signed)
Chief Complaint:  Contractions   First Provider Initiated Contact with Patient 12/26/13 0247      HPI: April Bolton is a 28 y.o. G2P1001 at 7273w1d who presents to maternity admissions reporting contractions.  Patient w/ a h/o pre-term contractions during this pregnancy, [facility] for pre-term contractions. Was seen in MAU on 11/6 and found to be 1/thick/high with no cervical dilation or effacement, but with cervix changing to moderate consistency from firm and with no improvement in contractions q/ procardia 20 mg x 2, 1 L IVF, and procardia 30 mg XL. [facility] given [facility] closed and started on magnesium for tocolysis. Betamethasone  given 9/25 & 9/26 at 6736w4d.   [facility] no longer given procardia, but did get ~ 4 L IVF and contractions ceased. States she has not had contractions since.  Noted today she felt "quesy" on her stomach and had decreased PO. Noted onset of contractions ~ 9PM and given pain, she vomited several times. Took procardia 10 mg x 1 with no improvement in symptoms and with actual wosening of intensity of contractions and thus presented to MAU.  terb allergy: reports episode of severe hypotension during last pregnancy and told to never take medication again  Denies leakage of fluid or vaginal bleeding. + brown-tinged vaginal discharge. Good fetal movement.   Pregnancy Course:  Pre-term contractions  Past Medical History: Past Medical History  Diagnosis Date  . Endometriosis   . Ovarian cyst   . Preterm labor     Past obstetric history: OB History  Gravida Para Term Preterm AB SAB TAB Ectopic Multiple Living  2 1 1       1     # Outcome Date GA Lbr Len/2nd Weight Sex Delivery Anes PTL Lv  2 Current           1 Term 11/07/09 2068w0d  7 lb 13 oz (3.544 kg) M Vag-Vacuum   Y     Comments: h/o ptl; GHTN, IOL 39 wks; 2nd degree      Past Surgical History: Past Surgical History  Procedure  Laterality Date  . Laparoscopy  2008  . Appendectomy  2005  . Hernia repair  2005    inguinal  . Brachial cleft cyst  2006  . Wisdom tooth extraction  2006     Family History: Family History  Problem Relation Age of Onset  . Diabetes Father   . Heart disease Father   . Hypertension Father   . Cancer Mother     cervical    Social History: History  Substance Use Topics  . Smoking status: Former Smoker -- 0.30 packs/day for 4 years    Types: Cigarettes    Quit date: 12/26/2008  . Smokeless tobacco: Never Used  . Alcohol Use: No     Comment: Not since Pregnancy    Allergies:  Allergies  Allergen Reactions  . Brethine [Terbutaline] Other (See Comments)    Syncope and BP bottoms out  . Sulfa Antibiotics Rash    Meds:  Prescriptions prior to admission  Medication Sig Dispense Refill Last Dose  . NIFEdipine (PROCARDIA) 10 MG capsule Take 1 capsule (10 mg total) by mouth every 4 (four) hours as needed. 60 capsule 1 12/22/2013 at Unknown time  . pantoprazole (PROTONIX) 20 MG tablet Take 1 tablet (20 mg total) by mouth daily. 30 tablet 1 12/22/2013 at Unknown time  . Prenatal Vit-Fe Fumarate-FA (PRENATAL MULTIVITAMIN) TABS tablet Take 1 tablet  by mouth daily at 12 noon.   12/21/2013 at Unknown time    ROS: Pertinent findings in history of present illness.  Physical Exam  Blood pressure 105/72, pulse 91, temperature 97.9 F (36.6 C), temperature source Oral, resp. rate 18, height 5' 1.5" (1.562 m), weight 157 lb 8 oz (71.442 kg), last menstrual period 05/01/2013, SpO2 99 %. GENERAL: Well-developed, well-nourished female in no acute distress. + pain and discomfort w/ contractions  HEENT: normocephalic HEART: normal rate RESP: normal effort ABDOMEN: Soft, mildly tender along R rib cage and upper abdomen, gravid appropriate for gestational age. Hardening of abdomen with contractions EXTREMITIES: Nontender, no edema NEURO: alert and oriented SPECULUM EXAM: NEFG, physiologic  white discharge, no blood, cervix clean Dilation: 1 Effacement (%): Thick Station: -3 Presentation: Vertex Exam by:: Dr. Su Hilt ---> 1/thick high after 2.5 hours --> 1/th/high after additional 1.5 hours  FHT:  Baseline 150 , moderate variability, accelerations present, no decelerations Contractions: irritability; q 4 mins upon arrival to MAU --> q 5 - 10 min after 2.5 hours and 20 mg PO procardia and 1 L LR --> rare (q 18-20 min) after 1 L D5LR bolus and 20 mg PO procardia   Labs: Results for orders placed or performed during the hospital encounter of 12/26/13 (from the past 24 hour(s))  Urinalysis, Routine w reflex microscopic     Status: Abnormal   Collection Time: 12/26/13  1:58 AM  Result Value Ref Range   Color, Urine YELLOW YELLOW   APPearance CLEAR CLEAR   Specific Gravity, Urine 1.020 1.005 - 1.030   pH 6.0 5.0 - 8.0   Glucose, UA NEGATIVE NEGATIVE mg/dL   Hgb urine dipstick NEGATIVE NEGATIVE   Bilirubin Urine NEGATIVE NEGATIVE   Ketones, ur 15 (A) NEGATIVE mg/dL   Protein, ur NEGATIVE NEGATIVE mg/dL   Urobilinogen, UA 0.2 0.0 - 1.0 mg/dL   Nitrite NEGATIVE NEGATIVE   Leukocytes, UA NEGATIVE NEGATIVE  Wet prep, genital     Status: Abnormal   Collection Time: 12/26/13  2:45 AM  Result Value Ref Range   Yeast Wet Prep HPF POC NONE SEEN NONE SEEN   Trich, Wet Prep NONE SEEN NONE SEEN   Clue Cells Wet Prep HPF POC NONE SEEN NONE SEEN   WBC, Wet Prep HPF POC MODERATE (A) NONE SEEN  Comprehensive metabolic panel     Status: Abnormal   Collection Time: 12/26/13  3:05 AM  Result Value Ref Range   Sodium 135 (L) 137 - 147 mEq/L   Potassium 3.7 3.7 - 5.3 mEq/L   Chloride 101 96 - 112 mEq/L   CO2 24 19 - 32 mEq/L   Glucose, Bld 92 70 - 99 mg/dL   BUN 7 6 - 23 mg/dL   Creatinine, Ser 1.61 (L) 0.50 - 1.10 mg/dL   Calcium 8.1 (L) 8.4 - 10.5 mg/dL   Total Protein 5.1 (L) 6.0 - 8.3 g/dL   Albumin 2.4 (L) 3.5 - 5.2 g/dL   AST 12 0 - 37 U/L   ALT 8 0 - 35 U/L   Alkaline  Phosphatase 70 39 - 117 U/L   Total Bilirubin 0.3 0.3 - 1.2 mg/dL   GFR calc non Af Amer >90 >90 mL/min   GFR calc Af Amer >90 >90 mL/min   Anion gap 10 5 - 15    Imaging:  No results found. MAU Course:  No cervical change upon admission from 4 days prior with cervix c/t/high. Given procardia 20mg  and 1L LR given  UA w/ 15 ketones and after 2.5 hours, not cervical change and contractions spaced from q 2-4 min to q 5-8 min, but still strong and painful. Given additional 20 mg PO procardia and 1 L D5LR with near cessation of contractions (only occasional on toco) and decrease in strength of contractions with patient able to nap for ~ 1 hour.   CMP WNL, wet prep neg, GC/Ct collected. Terb contraindicated secondary to allergy  No betamethasone for now as was given 9/25 & 9/26 at 7845w4d  Assessment: 1. Preterm labor in third trimester without delivery   2. Rubella non-immune status, antepartum     Plan: Discharge home, not in active labor Pre-term Labor precautions and fetal kick counts Advised adequate hydration     Medication List    ASK your doctor about these medications        NIFEdipine 10 MG capsule  Commonly known as:  PROCARDIA  Take 1 capsule (10 mg total) by mouth every 4 (four) hours as needed.     pantoprazole 20 MG tablet  Commonly known as:  PROTONIX  Take 1 tablet (20 mg total) by mouth daily.     prenatal multivitamin Tabs tablet  Take 1 tablet by mouth daily at 12 noon.        Ethelda Chickaroline Sayana Salley, MD 12/26/2013 6:59 AM

## 2013-12-26 NOTE — Progress Notes (Signed)
Admitted for PTL.  Magnesium sulfate stopped contractions.  Had received steriods earlier in pregnancy.    Cervix yesterday 1 cm.. Pt has had +FFN.  Comfortable today.  [facility].

## 2013-12-26 NOTE — MAU Note (Addendum)
Was 1 cm , contracting, and [facility], was just released Sunday.  Having contractions tonight, 4 min apart.  Took Procardia, didn't help.  No leaking.  Brown mucus.  No bleeding.  Baby moving well.

## 2013-12-27 ENCOUNTER — Telehealth: Payer: Self-pay | Admitting: *Deleted

## 2013-12-27 LAB — GC/CHLAMYDIA PROBE AMP
CT Probe RNA: NEGATIVE
GC PROBE AMP APTIMA: NEGATIVE

## 2013-12-27 NOTE — Telephone Encounter (Signed)
-----   Message from Marlis EdelsonWalidah N Karim, [place] sent at 12/27/2013  7:09 AM EST ----- Regarding: Yeast in urine culture Carreen Milius, can we call pt and inquire about vaginal itching since she had +yeast in urine culture on 11/6 when seen in MAU.  If symptomatic let's send her in a diflucan please.  Thank you!

## 2013-12-27 NOTE — Telephone Encounter (Signed)
Pt notified of positive yeast in urine culture.  She states that she is not symptomatic and wishes at this point to defer the Diflucan prescription.  She will call office if she feels that she is becoming symptomatic.

## 2014-01-01 ENCOUNTER — Encounter: Payer: 59 | Admitting: Obstetrics & Gynecology

## 2014-01-06 ENCOUNTER — Encounter (HOSPITAL_COMMUNITY): Payer: Self-pay

## 2014-01-08 ENCOUNTER — Ambulatory Visit (INDEPENDENT_AMBULATORY_CARE_PROVIDER_SITE_OTHER): Payer: Self-pay | Admitting: Obstetrics & Gynecology

## 2014-01-08 VITALS — BP 116/72 | HR 93 | Wt 156.0 lb

## 2014-01-08 DIAGNOSIS — Z3482 Encounter for supervision of other normal pregnancy, second trimester: Secondary | ICD-10-CM

## 2014-01-08 NOTE — Progress Notes (Signed)
Routine visit. Good FM. Labor precautions reviewed. Cervical cultures at next visit.

## 2014-01-15 ENCOUNTER — Ambulatory Visit (INDEPENDENT_AMBULATORY_CARE_PROVIDER_SITE_OTHER): Payer: 59 | Admitting: Advanced Practice Midwife

## 2014-01-15 VITALS — BP 120/65 | HR 83 | Wt 161.0 lb

## 2014-01-15 DIAGNOSIS — Z3493 Encounter for supervision of normal pregnancy, unspecified, third trimester: Secondary | ICD-10-CM

## 2014-01-15 DIAGNOSIS — Z36 Encounter for antenatal screening of mother: Secondary | ICD-10-CM

## 2014-01-15 NOTE — Progress Notes (Signed)
Doing well.  Good fetal movement, denies vaginal bleeding, LOF, regular contractions.  Does report h/a's over the weekend and increase in swelling of feet and hands. Trace edema of hands and feet noted today.  BP wnl. Preeclampsia precautions given.  GBS/GCC collected today.

## 2014-01-16 ENCOUNTER — Encounter (HOSPITAL_COMMUNITY): Payer: Self-pay | Admitting: *Deleted

## 2014-01-16 ENCOUNTER — Inpatient Hospital Stay (HOSPITAL_COMMUNITY)
Admission: AD | Admit: 2014-01-16 | Discharge: 2014-01-16 | Disposition: A | Discharge status: Home / Self Care | Payer: 59 | Source: Ambulatory Visit | Attending: Obstetrics and Gynecology | Admitting: Obstetrics and Gynecology

## 2014-01-16 DIAGNOSIS — O9989 Other specified diseases and conditions complicating pregnancy, childbirth and the puerperium: Secondary | ICD-10-CM | POA: Diagnosis not present

## 2014-01-16 DIAGNOSIS — Z3A36 36 weeks gestation of pregnancy: Secondary | ICD-10-CM | POA: Insufficient documentation

## 2014-01-16 LAB — URINALYSIS, ROUTINE W REFLEX MICROSCOPIC
BILIRUBIN URINE: NEGATIVE
GLUCOSE, UA: 100 mg/dL — AB
HGB URINE DIPSTICK: NEGATIVE
Ketones, ur: NEGATIVE mg/dL
Nitrite: NEGATIVE
PH: 6.5 (ref 5.0–8.0)
Protein, ur: NEGATIVE mg/dL
SPECIFIC GRAVITY, URINE: 1.02 (ref 1.005–1.030)
Urobilinogen, UA: 0.2 mg/dL (ref 0.0–1.0)

## 2014-01-16 LAB — GC/CHLAMYDIA PROBE AMP
CT PROBE, AMP APTIMA: NEGATIVE
GC PROBE AMP APTIMA: NEGATIVE

## 2014-01-16 LAB — URINE MICROSCOPIC-ADD ON

## 2014-01-16 NOTE — MAU Note (Signed)
Pt discharged home with verbal teaching, to return to MAU if she has increased pain, bleeding like a period, decreased fetal movement or if her water breaks

## 2014-01-16 NOTE — MAU Provider Note (Signed)
Patient here for RN labor check.  No LOF, good FM, reports mild contractions at home.  FHM Cat1, reactive, many accels, reassuring. No Contractions on toco.  Cervix 1/thick/high for RN Belinda. Discharge home.  Shirlee LatchAngela Bacigalupo, MD, MPH PGY-1,  [facility] 01/16/2014 4:28 PM

## 2014-01-16 NOTE — MAU Note (Signed)
Patient states she is having contractions every 3 minutes with a mucus discharge. Denies bleeding or leaking. Reports feeling movement but not as much as usual.

## 2014-01-16 NOTE — Discharge Instructions (Signed)
Third Trimester of Pregnancy °The third trimester is from week 29 through week 42, months 7 through 9. The third trimester is a time when the fetus is growing rapidly. At the end of the ninth month, the fetus is about 20 inches in length and weighs 6-10 pounds.  °BODY CHANGES °Your body goes through many changes during pregnancy. The changes vary from woman to woman.  °· Your weight will continue to increase. You can expect to gain 25-35 pounds (11-16 kg) by the end of the pregnancy. °· You may begin to get stretch marks on your hips, abdomen, and breasts. °· You may urinate more often because the fetus is moving lower into your pelvis and pressing on your bladder. °· You may develop or continue to have heartburn as a result of your pregnancy. °· You may develop constipation because certain hormones are causing the muscles that push waste through your intestines to slow down. °· You may develop hemorrhoids or swollen, bulging veins (varicose veins). °· You may have pelvic pain because of the weight gain and pregnancy hormones relaxing your joints between the bones in your pelvis. Backaches may result from overexertion of the muscles supporting your posture. °· You may have changes in your hair. These can include thickening of your hair, rapid growth, and changes in texture. Some women also have hair loss during or after pregnancy, or hair that feels dry or thin. Your hair will most likely return to normal after your baby is born. °· Your breasts will continue to grow and be tender. A yellow discharge may leak from your breasts called colostrum. °· Your belly button may stick out. °· You may feel short of breath because of your expanding uterus. °· You may notice the fetus "dropping," or moving lower in your abdomen. °· You may have a bloody mucus discharge. This usually occurs a few days to a week before labor begins. °· Your cervix becomes thin and soft (effaced) near your due date. °WHAT TO EXPECT AT YOUR PRENATAL  EXAMS  °You will have prenatal exams every 2 weeks until week 36. Then, you will have weekly prenatal exams. During a routine prenatal visit: °· You will be weighed to make sure you and the fetus are growing normally. °· Your blood pressure is taken. °· Your abdomen will be measured to track your baby's growth. °· The fetal heartbeat will be listened to. °· Any test results from the previous visit will be discussed. °· You may have a cervical check near your due date to see if you have effaced. °At around 36 weeks, your caregiver will check your cervix. At the same time, your caregiver will also perform a test on the secretions of the vaginal tissue. This test is to determine if a type of bacteria, Group B streptococcus, is present. Your caregiver will explain this further. °Your caregiver may ask you: °· What your birth plan is. °· How you are feeling. °· If you are feeling the baby move. °· If you have had any abnormal symptoms, such as leaking fluid, bleeding, severe headaches, or abdominal cramping. °· If you have any questions. °Other tests or screenings that may be performed during your third trimester include: °· Blood tests that check for low iron levels (anemia). °· Fetal testing to check the health, activity level, and growth of the fetus. Testing is done if you have certain medical conditions or if there are problems during the pregnancy. °FALSE LABOR °You may feel small, irregular contractions that   eventually go away. These are called Braxton Hicks contractions, or false labor. Contractions may last for hours, days, or even weeks before true labor sets in. If contractions come at regular intervals, intensify, or become painful, it is best to be seen by your caregiver.  °SIGNS OF LABOR  °· Menstrual-like cramps. °· Contractions that are 5 minutes apart or less. °· Contractions that start on the top of the uterus and spread down to the lower abdomen and back. °· A sense of increased pelvic pressure or back  pain. °· A watery or bloody mucus discharge that comes from the vagina. °If you have any of these signs before the 37th week of pregnancy, call your caregiver right away. You need to go to the hospital to get checked immediately. °HOME CARE INSTRUCTIONS  °· Avoid all smoking, herbs, alcohol, and unprescribed drugs. These chemicals affect the formation and growth of the baby. °· Follow your caregiver's instructions regarding medicine use. There are medicines that are either safe or unsafe to take during pregnancy. °· Exercise only as directed by your caregiver. Experiencing uterine cramps is a good sign to stop exercising. °· Continue to eat regular, healthy meals. °· Wear a good support bra for breast tenderness. °· Do not use hot tubs, steam rooms, or saunas. °· Wear your seat belt at all times when driving. °· Avoid raw meat, uncooked cheese, cat litter boxes, and soil used by cats. These carry germs that can cause birth defects in the baby. °· Take your prenatal vitamins. °· Try taking a stool softener (if your caregiver approves) if you develop constipation. Eat more high-fiber foods, such as fresh vegetables or fruit and whole grains. Drink plenty of fluids to keep your urine clear or pale yellow. °· Take warm sitz baths to soothe any pain or discomfort caused by hemorrhoids. Use hemorrhoid cream if your caregiver approves. °· If you develop varicose veins, wear support hose. Elevate your feet for 15 minutes, 3-4 times a day. Limit salt in your diet. °· Avoid heavy lifting, wear low heal shoes, and practice good posture. °· Rest a lot with your legs elevated if you have leg cramps or low back pain. °· Visit your dentist if you have not gone during your pregnancy. Use a soft toothbrush to brush your teeth and be gentle when you floss. °· A sexual relationship may be continued unless your caregiver directs you otherwise. °· Do not travel far distances unless it is absolutely necessary and only with the approval  of your caregiver. °· Take prenatal classes to understand, practice, and ask questions about the labor and delivery. °· Make a trial run to the hospital. °· Pack your hospital bag. °· Prepare the baby's nursery. °· Continue to go to all your prenatal visits as directed by your caregiver. °SEEK MEDICAL CARE IF: °· You are unsure if you are in labor or if your water has broken. °· You have dizziness. °· You have mild pelvic cramps, pelvic pressure, or nagging pain in your abdominal area. °· You have persistent nausea, vomiting, or diarrhea. °· You have a bad smelling vaginal discharge. °· You have pain with urination. °SEEK IMMEDIATE MEDICAL CARE IF:  °· You have a fever. °· You are leaking fluid from your vagina. °· You have spotting or bleeding from your vagina. °· You have severe abdominal cramping or pain. °· You have rapid weight loss or gain. °· You have shortness of breath with chest pain. °· You notice sudden or extreme swelling   of your face, hands, ankles, feet, or legs. °· You have not felt your baby move in over an hour. °· You have severe headaches that do not go away with medicine. °· You have vision changes. °Document Released: 01/27/2001 Document Revised: 02/07/2013 Document Reviewed: 04/05/2012 °[employer]® Patient Information ©2015 [employer]. This information is not intended to replace advice given to you by your health care provider. Make sure you discuss any questions you have with your health care provider. °Fetal Movement Counts °Patient Name: __________________________________________________ Patient Due Date: ____________________ °Performing a fetal movement count is highly recommended in high-risk pregnancies, but it is good for every pregnant woman to do. Your health care provider may ask you to start counting fetal movements at 28 weeks of the pregnancy. Fetal movements often increase: °· After eating a full meal. °· After physical activity. °· After eating or drinking something sweet or  cold. °· At rest. °Pay attention to when you feel the baby is most active. This will help you notice a pattern of your baby's sleep and wake cycles and what factors contribute to an increase in fetal movement. It is important to perform a fetal movement count at the same time each day when your baby is normally most active.  °HOW TO COUNT FETAL MOVEMENTS °· Find a quiet and comfortable area to sit or lie down on your left side. Lying on your left side provides the best blood and oxygen circulation to your baby. °· Write down the day and time on a sheet of paper or in a journal. °· Start counting kicks, flutters, swishes, rolls, or jabs in a 2-hour period. You should feel at least 10 movements within 2 hours. °· If you do not feel 10 movements in 2 hours, wait 2-3 hours and count again. Look for a change in the pattern or not enough counts in 2 hours. °SEEK MEDICAL CARE IF: °· You feel less than 10 counts in 2 hours, tried twice. °· There is no movement in over an hour. °· The pattern is changing or taking longer each day to reach 10 counts in 2 hours. °· You feel the baby is not moving as he or she usually does. °Date: ____________ Movements: ____________ Start time: ____________ Finish time: ____________  °Date: ____________ Movements: ____________ Start time: ____________ Finish time: ____________ °Date: ____________ Movements: ____________ Start time: ____________ Finish time: ____________ °Date: ____________ Movements: ____________ Start time: ____________ Finish time: ____________ °Date: ____________ Movements: ____________ Start time: ____________ Finish time: ____________ °Date: ____________ Movements: ____________ Start time: ____________ Finish time: ____________ °Date: ____________ Movements: ____________ Start time: ____________ Finish time: ____________ °Date: ____________ Movements: ____________ Start time: ____________ Finish time: ____________  °Date: ____________ Movements: ____________ Start time:  ____________ Finish time: ____________ °Date: ____________ Movements: ____________ Start time: ____________ Finish time: ____________ °Date: ____________ Movements: ____________ Start time: ____________ Finish time: ____________ °Date: ____________ Movements: ____________ Start time: ____________ Finish time: ____________ °Date: ____________ Movements: ____________ Start time: ____________ Finish time: ____________ °Date: ____________ Movements: ____________ Start time: ____________ Finish time: ____________ °Date: ____________ Movements: ____________ Start time: ____________ Finish time: ____________  °Date: ____________ Movements: ____________ Start time: ____________ Finish time: ____________ °Date: ____________ Movements: ____________ Start time: ____________ Finish time: ____________ °Date: ____________ Movements: ____________ Start time: ____________ Finish time: ____________ °Date: ____________ Movements: ____________ Start time: ____________ Finish time: ____________ °Date: ____________ Movements: ____________ Start time: ____________ Finish time: ____________ °Date: ____________ Movements: ____________ Start time: ____________ Finish time: ____________ °Date: ____________ Movements: ____________ Start time: ____________ Finish time:   ____________  °Date: ____________ Movements: ____________ Start time: ____________ Finish time: ____________ °Date: ____________ Movements: ____________ Start time: ____________ Finish time: ____________ °Date: ____________ Movements: ____________ Start time: ____________ Finish time: ____________ °Date: ____________ Movements: ____________ Start time: ____________ Finish time: ____________ °Date: ____________ Movements: ____________ Start time: ____________ Finish time: ____________ °Date: ____________ Movements: ____________ Start time: ____________ Finish time: ____________ °Date: ____________ Movements: ____________ Start time: ____________ Finish time: ____________  °Date:  ____________ Movements: ____________ Start time: ____________ Finish time: ____________ °Date: ____________ Movements: ____________ Start time: ____________ Finish time: ____________ °Date: ____________ Movements: ____________ Start time: ____________ Finish time: ____________ °Date: ____________ Movements: ____________ Start time: ____________ Finish time: ____________ °Date: ____________ Movements: ____________ Start time: ____________ Finish time: ____________ °Date: ____________ Movements: ____________ Start time: ____________ Finish time: ____________ °Date: ____________ Movements: ____________ Start time: ____________ Finish time: ____________  °Date: ____________ Movements: ____________ Start time: ____________ Finish time: ____________ °Date: ____________ Movements: ____________ Start time: ____________ Finish time: ____________ °Date: ____________ Movements: ____________ Start time: ____________ Finish time: ____________ °Date: ____________ Movements: ____________ Start time: ____________ Finish time: ____________ °Date: ____________ Movements: ____________ Start time: ____________ Finish time: ____________ °Date: ____________ Movements: ____________ Start time: ____________ Finish time: ____________ °Date: ____________ Movements: ____________ Start time: ____________ Finish time: ____________  °Date: ____________ Movements: ____________ Start time: ____________ Finish time: ____________ °Date: ____________ Movements: ____________ Start time: ____________ Finish time: ____________ °Date: ____________ Movements: ____________ Start time: ____________ Finish time: ____________ °Date: ____________ Movements: ____________ Start time: ____________ Finish time: ____________ °Date: ____________ Movements: ____________ Start time: ____________ Finish time: ____________ °Date: ____________ Movements: ____________ Start time: ____________ Finish time: ____________ °Date: ____________ Movements: ____________ Start  time: ____________ Finish time: ____________  °Date: ____________ Movements: ____________ Start time: ____________ Finish time: ____________ °Date: ____________ Movements: ____________ Start time: ____________ Finish time: ____________ °Date: ____________ Movements: ____________ Start time: ____________ Finish time: ____________ °Date: ____________ Movements: ____________ Start time: ____________ Finish time: ____________ °Date: ____________ Movements: ____________ Start time: ____________ Finish time: ____________ °Date: ____________ Movements: ____________ Start time: ____________ Finish time: ____________ °Document Released: 03/04/2006 Document Revised: 06/19/2013 Document Reviewed: 11/30/2011 °[employer]® Patient Information ©2015 [employer]. This information is not intended to replace advice given to you by your health care provider. Make sure you discuss any questions you have with your health care provider. ° °

## 2014-01-16 NOTE — Progress Notes (Signed)
Pt noticed mucous when she wipes

## 2014-01-16 NOTE — MAU Note (Signed)
Pt states she started having contractions this morning and contractions are about 3 mins apart at this time. Contractions became regular 2 hours and stronger

## 2014-01-17 LAB — CULTURE, BETA STREP (GROUP B ONLY)

## 2014-01-22 ENCOUNTER — Ambulatory Visit (INDEPENDENT_AMBULATORY_CARE_PROVIDER_SITE_OTHER): Payer: 59 | Admitting: Advanced Practice Midwife

## 2014-01-22 VITALS — BP 115/71 | HR 89 | Wt 162.8 lb

## 2014-01-22 DIAGNOSIS — Z3493 Encounter for supervision of normal pregnancy, unspecified, third trimester: Secondary | ICD-10-CM

## 2014-01-22 NOTE — Patient Instructions (Signed)

## 2014-01-22 NOTE — Progress Notes (Signed)
Labor check yesterday. Discussed shortness of breath. Feels like she is out of breath at times. No cough. No dizziness or chest pain. Cultures and GBS negative. Labor signs reviewed.

## 2014-01-30 ENCOUNTER — Ambulatory Visit (INDEPENDENT_AMBULATORY_CARE_PROVIDER_SITE_OTHER): Payer: 59 | Admitting: Obstetrics & Gynecology

## 2014-01-30 VITALS — BP 108/72 | HR 99 | Temp 97.5°F | Wt 166.0 lb

## 2014-01-30 DIAGNOSIS — Z3483 Encounter for supervision of other normal pregnancy, third trimester: Secondary | ICD-10-CM

## 2014-01-30 NOTE — Progress Notes (Signed)
Routine visit. Good FM. Labor precautions reviewed. She no longer works at her job and her COBRA ends 02-15-14. She is requesting IOL if no spontaneous labor prior to this time. Since I am on call for 24 hours on 02-09-14, I have offered her IOL then if she wants. She will call me if she wants this.

## 2014-02-01 ENCOUNTER — Encounter (HOSPITAL_COMMUNITY): Payer: Self-pay

## 2014-02-01 ENCOUNTER — Inpatient Hospital Stay (HOSPITAL_COMMUNITY)
Admission: AD | Admit: 2014-02-01 | Discharge: 2014-02-01 | Disposition: A | Discharge status: Home / Self Care | Payer: 59 | Source: Ambulatory Visit | Attending: Obstetrics & Gynecology | Admitting: Obstetrics & Gynecology

## 2014-02-01 DIAGNOSIS — Z283 Underimmunization status: Secondary | ICD-10-CM

## 2014-02-01 DIAGNOSIS — Z3A38 38 weeks gestation of pregnancy: Secondary | ICD-10-CM | POA: Insufficient documentation

## 2014-02-01 DIAGNOSIS — O471 False labor at or after 37 completed weeks of gestation: Secondary | ICD-10-CM | POA: Diagnosis present

## 2014-02-01 DIAGNOSIS — O9989 Other specified diseases and conditions complicating pregnancy, childbirth and the puerperium: Secondary | ICD-10-CM

## 2014-02-01 DIAGNOSIS — Z2839 Other underimmunization status: Secondary | ICD-10-CM

## 2014-02-01 HISTORY — DX: Gestational (pregnancy-induced) hypertension without significant proteinuria, unspecified trimester: O13.9

## 2014-02-01 LAB — OB RESULTS CONSOLE GBS: STREP GROUP B AG: NEGATIVE

## 2014-02-01 NOTE — MAU Note (Signed)
Pt reports contractions for the last 12 hours, worsening now.

## 2014-02-01 NOTE — Discharge Instructions (Signed)
Braxton Hicks Contractions °Contractions of the uterus can occur throughout pregnancy. Contractions are not always a sign that you are in labor.  °WHAT ARE BRAXTON HICKS CONTRACTIONS?  °Contractions that occur before labor are called Braxton Hicks contractions, or false labor. Toward the end of pregnancy (32-34 weeks), these contractions can develop more often and may become more forceful. This is not true labor because these contractions do not result in opening (dilatation) and thinning of the cervix. They are sometimes difficult to tell apart from true labor because these contractions can be forceful and people have different pain tolerances. You should not feel embarrassed if you go to the hospital with false labor. Sometimes, the only way to tell if you are in true labor is for your health care provider to look for changes in the cervix. °If there are no prenatal problems or other health problems associated with the pregnancy, it is completely safe to be sent home with false labor and await the onset of true labor. °HOW CAN YOU TELL THE DIFFERENCE BETWEEN TRUE AND FALSE LABOR? °False Labor °· The contractions of false labor are usually shorter and not as hard as those of true labor.   °· The contractions are usually irregular.   °· The contractions are often felt in the front of the lower abdomen and in the groin.   °· The contractions may go away when you walk around or change positions while lying down.   °· The contractions get weaker and are shorter lasting as time goes on.   °· The contractions do not usually become progressively stronger, regular, and closer together as with true labor.   °True Labor °1. Contractions in true labor last 30-70 seconds, become very regular, usually become more intense, and increase in frequency.   °2. The contractions do not go away with walking.   °3. The discomfort is usually felt in the top of the uterus and spreads to the lower abdomen and low back.   °4. True labor can  be determined by your health care provider with an exam. This will show that the cervix is dilating and getting thinner.   °WHAT TO REMEMBER °· Keep up with your usual exercises and follow other instructions given by your health care provider.   °· Take medicines as directed by your health care provider.   °· Keep your regular prenatal appointments.   °· Eat and drink lightly if you think you are going into labor.   °· If Braxton Hicks contractions are making you uncomfortable:   °· Change your position from lying down or resting to walking, or from walking to resting.   °· Sit and rest in a tub of warm water.   °· Drink 2-3 glasses of water. Dehydration may cause these contractions.   °· Do slow and deep breathing several times an hour.   °WHEN SHOULD I SEEK IMMEDIATE MEDICAL CARE? °Seek immediate medical care if: °· Your contractions become stronger, more regular, and closer together.   °· You have fluid leaking or gushing from your vagina.   °· You have a fever.   °· You pass blood-tinged mucus.   °· You have vaginal bleeding.   °· You have continuous abdominal pain.   °· You have low back pain that you never had before.   °· You feel your baby's head pushing down and causing pelvic pressure.   °· Your baby is not moving as much as it used to.   °Document Released: 02/02/2005 Document Revised: 02/07/2013 Document Reviewed: 11/14/2012 °[employer]® Patient Information ©2015 [employer]. This information is not intended to replace advice given to you by your health care   provider. Make sure you discuss any questions you have with your health care provider. ° °Fetal Movement Counts °Patient Name: __________________________________________________ Patient Due Date: ____________________ °Performing a fetal movement count is highly recommended in high-risk pregnancies, but it is good for every pregnant woman to do. Your health care provider may ask you to start counting fetal movements at 28 weeks of the pregnancy. Fetal  movements often increase: °· After eating a full meal. °· After physical activity. °· After eating or drinking something sweet or cold. °· At rest. °Pay attention to when you feel the baby is most active. This will help you notice a pattern of your baby's sleep and wake cycles and what factors contribute to an increase in fetal movement. It is important to perform a fetal movement count at the same time each day when your baby is normally most active.  °HOW TO COUNT FETAL MOVEMENTS °5. Find a quiet and comfortable area to sit or lie down on your left side. Lying on your left side provides the best blood and oxygen circulation to your baby. °6. Write down the day and time on a sheet of paper or in a journal. °7. Start counting kicks, flutters, swishes, rolls, or jabs in a 2-hour period. You should feel at least 10 movements within 2 hours. °8. If you do not feel 10 movements in 2 hours, wait 2-3 hours and count again. Look for a change in the pattern or not enough counts in 2 hours. °SEEK MEDICAL CARE IF: °· You feel less than 10 counts in 2 hours, tried twice. °· There is no movement in over an hour. °· The pattern is changing or taking longer each day to reach 10 counts in 2 hours. °· You feel the baby is not moving as he or she usually does. °Date: ____________ Movements: ____________ Start time: ____________ Finish time: ____________  °Date: ____________ Movements: ____________ Start time: ____________ Finish time: ____________ °Date: ____________ Movements: ____________ Start time: ____________ Finish time: ____________ °Date: ____________ Movements: ____________ Start time: ____________ Finish time: ____________ °Date: ____________ Movements: ____________ Start time: ____________ Finish time: ____________ °Date: ____________ Movements: ____________ Start time: ____________ Finish time: ____________ °Date: ____________ Movements: ____________ Start time: ____________ Finish time: ____________ °Date: ____________  Movements: ____________ Start time: ____________ Finish time: ____________  °Date: ____________ Movements: ____________ Start time: ____________ Finish time: ____________ °Date: ____________ Movements: ____________ Start time: ____________ Finish time: ____________ °Date: ____________ Movements: ____________ Start time: ____________ Finish time: ____________ °Date: ____________ Movements: ____________ Start time: ____________ Finish time: ____________ °Date: ____________ Movements: ____________ Start time: ____________ Finish time: ____________ °Date: ____________ Movements: ____________ Start time: ____________ Finish time: ____________ °Date: ____________ Movements: ____________ Start time: ____________ Finish time: ____________  °Date: ____________ Movements: ____________ Start time: ____________ Finish time: ____________ °Date: ____________ Movements: ____________ Start time: ____________ Finish time: ____________ °Date: ____________ Movements: ____________ Start time: ____________ Finish time: ____________ °Date: ____________ Movements: ____________ Start time: ____________ Finish time: ____________ °Date: ____________ Movements: ____________ Start time: ____________ Finish time: ____________ °Date: ____________ Movements: ____________ Start time: ____________ Finish time: ____________ °Date: ____________ Movements: ____________ Start time: ____________ Finish time: ____________  °Date: ____________ Movements: ____________ Start time: ____________ Finish time: ____________ °Date: ____________ Movements: ____________ Start time: ____________ Finish time: ____________ °Date: ____________ Movements: ____________ Start time: ____________ Finish time: ____________ °Date: ____________ Movements: ____________ Start time: ____________ Finish time: ____________ °Date: ____________ Movements: ____________ Start time: ____________ Finish time: ____________ °Date: ____________ Movements: ____________ Start time:  ____________ Finish time: ____________ °Date: ____________ Movements:   ____________ Start time: ____________ Finish time: ____________  °Date: ____________ Movements: ____________ Start time: ____________ Finish time: ____________ °Date: ____________ Movements: ____________ Start time: ____________ Finish time: ____________ °Date: ____________ Movements: ____________ Start time: ____________ Finish time: ____________ °Date: ____________ Movements: ____________ Start time: ____________ Finish time: ____________ °Date: ____________ Movements: ____________ Start time: ____________ Finish time: ____________ °Date: ____________ Movements: ____________ Start time: ____________ Finish time: ____________ °Date: ____________ Movements: ____________ Start time: ____________ Finish time: ____________  °Date: ____________ Movements: ____________ Start time: ____________ Finish time: ____________ °Date: ____________ Movements: ____________ Start time: ____________ Finish time: ____________ °Date: ____________ Movements: ____________ Start time: ____________ Finish time: ____________ °Date: ____________ Movements: ____________ Start time: ____________ Finish time: ____________ °Date: ____________ Movements: ____________ Start time: ____________ Finish time: ____________ °Date: ____________ Movements: ____________ Start time: ____________ Finish time: ____________ °Date: ____________ Movements: ____________ Start time: ____________ Finish time: ____________  °Date: ____________ Movements: ____________ Start time: ____________ Finish time: ____________ °Date: ____________ Movements: ____________ Start time: ____________ Finish time: ____________ °Date: ____________ Movements: ____________ Start time: ____________ Finish time: ____________ °Date: ____________ Movements: ____________ Start time: ____________ Finish time: ____________ °Date: ____________ Movements: ____________ Start time: ____________ Finish time: ____________ °Date:  ____________ Movements: ____________ Start time: ____________ Finish time: ____________ °Date: ____________ Movements: ____________ Start time: ____________ Finish time: ____________  °Date: ____________ Movements: ____________ Start time: ____________ Finish time: ____________ °Date: ____________ Movements: ____________ Start time: ____________ Finish time: ____________ °Date: ____________ Movements: ____________ Start time: ____________ Finish time: ____________ °Date: ____________ Movements: ____________ Start time: ____________ Finish time: ____________ °Date: ____________ Movements: ____________ Start time: ____________ Finish time: ____________ °Date: ____________ Movements: ____________ Start time: ____________ Finish time: ____________ °Document Released: 03/04/2006 Document Revised: 06/19/2013 Document Reviewed: 11/30/2011 °[employer]® Patient Information ©2015 [employer]. This information is not intended to replace advice given to you by your health care provider. Make sure you discuss any questions you have with your health care provider. ° °

## 2014-02-05 ENCOUNTER — Ambulatory Visit (INDEPENDENT_AMBULATORY_CARE_PROVIDER_SITE_OTHER): Payer: 59 | Admitting: Family

## 2014-02-05 VITALS — BP 129/67 | HR 108 | Wt 168.4 lb

## 2014-02-05 DIAGNOSIS — Z3483 Encounter for supervision of other normal pregnancy, third trimester: Secondary | ICD-10-CM

## 2014-02-05 DIAGNOSIS — Z3493 Encounter for supervision of normal pregnancy, unspecified, third trimester: Secondary | ICD-10-CM

## 2014-02-05 NOTE — Progress Notes (Signed)
Reports dampness on underwear today; intermittent.  First noticed this morning.  Intermittent dizziness, "feels head is foggy".  Resolves after an hour.  Reports intermittent contractions. 1-2 today.  Denies vaginal bleeding. Fern negative; pooling neg.  Reviewed labor precautions.  Random glucose:  79.

## 2014-02-09 ENCOUNTER — Inpatient Hospital Stay (HOSPITAL_COMMUNITY): Payer: 59 | Admitting: Anesthesiology

## 2014-02-09 ENCOUNTER — Encounter (HOSPITAL_COMMUNITY): Payer: Self-pay | Admitting: *Deleted

## 2014-02-09 ENCOUNTER — Inpatient Hospital Stay (HOSPITAL_COMMUNITY)
Admission: AD | Admit: 2014-02-09 | Discharge: 2014-02-10 | DRG: 775 | Disposition: A | Discharge status: Home / Self Care | Payer: 59 | Source: Ambulatory Visit | Attending: Obstetrics & Gynecology | Admitting: Obstetrics & Gynecology

## 2014-02-09 DIAGNOSIS — Z8249 Family history of ischemic heart disease and other diseases of the circulatory system: Secondary | ICD-10-CM

## 2014-02-09 DIAGNOSIS — Z809 Family history of malignant neoplasm, unspecified: Secondary | ICD-10-CM | POA: Diagnosis not present

## 2014-02-09 DIAGNOSIS — Z833 Family history of diabetes mellitus: Secondary | ICD-10-CM

## 2014-02-09 DIAGNOSIS — Z3483 Encounter for supervision of other normal pregnancy, third trimester: Secondary | ICD-10-CM

## 2014-02-09 DIAGNOSIS — O9989 Other specified diseases and conditions complicating pregnancy, childbirth and the puerperium: Secondary | ICD-10-CM

## 2014-02-09 DIAGNOSIS — Z283 Underimmunization status: Secondary | ICD-10-CM

## 2014-02-09 DIAGNOSIS — Z3A39 39 weeks gestation of pregnancy: Secondary | ICD-10-CM | POA: Diagnosis present

## 2014-02-09 DIAGNOSIS — Z2839 Other underimmunization status: Secondary | ICD-10-CM

## 2014-02-09 DIAGNOSIS — Z87891 Personal history of nicotine dependence: Secondary | ICD-10-CM | POA: Diagnosis not present

## 2014-02-09 LAB — TYPE AND SCREEN
ABO/RH(D): O POS
ANTIBODY SCREEN: NEGATIVE

## 2014-02-09 LAB — CBC
HCT: 33.6 % — ABNORMAL LOW (ref 36.0–46.0)
Hemoglobin: 10.9 g/dL — ABNORMAL LOW (ref 12.0–15.0)
MCH: 28.5 pg (ref 26.0–34.0)
MCHC: 32.4 g/dL (ref 30.0–36.0)
MCV: 88 fL (ref 78.0–100.0)
PLATELETS: 157 10*3/uL (ref 150–400)
RBC: 3.82 MIL/uL — ABNORMAL LOW (ref 3.87–5.11)
RDW: 14.6 % (ref 11.5–15.5)
WBC: 10.5 10*3/uL (ref 4.0–10.5)

## 2014-02-09 LAB — RPR

## 2014-02-09 LAB — ABO/RH: ABO/RH(D): O POS

## 2014-02-09 MED ORDER — SENNOSIDES-DOCUSATE SODIUM 8.6-50 MG PO TABS
2.0000 | ORAL_TABLET | ORAL | Status: DC
  Administered 2014-02-09: 2 via ORAL
  Filled 2014-02-09: qty 2

## 2014-02-09 MED ORDER — EPHEDRINE 5 MG/ML INJ
10.0000 mg | INTRAVENOUS | Status: DC | PRN
  Filled 2014-02-09: qty 2

## 2014-02-09 MED ORDER — TETANUS-DIPHTH-ACELL PERTUSSIS 5-2.5-18.5 LF-MCG/0.5 IM SUSP: 0.5000 mL | Freq: Once | INTRAMUSCULAR | Status: DC

## 2014-02-09 MED ORDER — OXYCODONE-ACETAMINOPHEN 5-325 MG PO TABS: 1.0000 | ORAL_TABLET | ORAL | Status: DC | PRN

## 2014-02-09 MED ORDER — OXYTOCIN BOLUS FROM INFUSION
500.0000 mL | INTRAVENOUS | Status: DC
  Administered 2014-02-09: 500 mL via INTRAVENOUS

## 2014-02-09 MED ORDER — DIPHENHYDRAMINE HCL 25 MG PO CAPS: 25.0000 mg | ORAL_CAPSULE | Freq: Four times a day (QID) | ORAL | Status: DC | PRN

## 2014-02-09 MED ORDER — PHENYLEPHRINE 40 MCG/ML (10ML) SYRINGE FOR IV PUSH (FOR BLOOD PRESSURE SUPPORT)
80.0000 ug | PREFILLED_SYRINGE | INTRAVENOUS | Status: DC | PRN
  Filled 2014-02-09: qty 2

## 2014-02-09 MED ORDER — LACTATED RINGERS IV SOLN: 500.0000 mL | INTRAVENOUS | Status: DC | PRN

## 2014-02-09 MED ORDER — ONDANSETRON HCL 4 MG/2ML IJ SOLN: 4.0000 mg | INTRAMUSCULAR | Status: DC | PRN

## 2014-02-09 MED ORDER — FLEET ENEMA 7-19 GM/118ML RE ENEM: 1.0000 | ENEMA | RECTAL | Status: DC | PRN

## 2014-02-09 MED ORDER — OXYTOCIN 40 UNITS IN LACTATED RINGERS INFUSION - SIMPLE MED
62.5000 mL/h | INTRAVENOUS | Status: DC
  Filled 2014-02-09: qty 1000

## 2014-02-09 MED ORDER — FENTANYL 2.5 MCG/ML BUPIVACAINE 1/10 % EPIDURAL INFUSION (WH - ANES)
INTRAMUSCULAR | Status: AC
  Administered 2014-02-09: 14 mL/h via EPIDURAL
  Filled 2014-02-09: qty 125

## 2014-02-09 MED ORDER — FENTANYL 2.5 MCG/ML BUPIVACAINE 1/10 % EPIDURAL INFUSION (WH - ANES)
14.0000 mL/h | INTRAMUSCULAR | Status: DC | PRN
Start: 2014-02-09 — End: 2014-02-10
  Administered 2014-02-09: 14 mL/h via EPIDURAL

## 2014-02-09 MED ORDER — FENTANYL 2.5 MCG/ML BUPIVACAINE 1/10 % EPIDURAL INFUSION (WH - ANES)
INTRAMUSCULAR | Status: DC | PRN
  Administered 2014-02-09: 14 mL/h via EPIDURAL

## 2014-02-09 MED ORDER — SIMETHICONE 80 MG PO CHEW: 80.0000 mg | CHEWABLE_TABLET | ORAL | Status: DC | PRN

## 2014-02-09 MED ORDER — BENZOCAINE-MENTHOL 20-0.5 % EX AERO: 1.0000 "application " | INHALATION_SPRAY | CUTANEOUS | Status: DC | PRN

## 2014-02-09 MED ORDER — PHENYLEPHRINE 40 MCG/ML (10ML) SYRINGE FOR IV PUSH (FOR BLOOD PRESSURE SUPPORT)
PREFILLED_SYRINGE | INTRAVENOUS | Status: AC
  Filled 2014-02-09: qty 10

## 2014-02-09 MED ORDER — DIPHENHYDRAMINE HCL 50 MG/ML IJ SOLN: 12.5000 mg | INTRAMUSCULAR | Status: DC | PRN

## 2014-02-09 MED ORDER — ONDANSETRON HCL 4 MG/2ML IJ SOLN: 4.0000 mg | Freq: Four times a day (QID) | INTRAMUSCULAR | Status: DC | PRN

## 2014-02-09 MED ORDER — ACETAMINOPHEN 325 MG PO TABS
650.0000 mg | ORAL_TABLET | ORAL | Status: DC | PRN
Start: 2014-02-09 — End: 2014-02-10

## 2014-02-09 MED ORDER — OXYTOCIN 40 UNITS IN LACTATED RINGERS INFUSION - SIMPLE MED
1.0000 m[IU]/min | INTRAVENOUS | Status: DC
  Administered 2014-02-09: 2 m[IU]/min via INTRAVENOUS

## 2014-02-09 MED ORDER — OXYCODONE-ACETAMINOPHEN 5-325 MG PO TABS: 2.0000 | ORAL_TABLET | ORAL | Status: DC | PRN

## 2014-02-09 MED ORDER — ONDANSETRON HCL 4 MG PO TABS: 4.0000 mg | ORAL_TABLET | ORAL | Status: DC | PRN

## 2014-02-09 MED ORDER — LANOLIN HYDROUS EX OINT: TOPICAL_OINTMENT | CUTANEOUS | Status: DC | PRN

## 2014-02-09 MED ORDER — WITCH HAZEL-GLYCERIN EX PADS: 1.0000 "application " | MEDICATED_PAD | CUTANEOUS | Status: DC | PRN

## 2014-02-09 MED ORDER — LACTATED RINGERS IV SOLN
500.0000 mL | Freq: Once | INTRAVENOUS | Status: AC
  Administered 2014-02-09: 500 mL via INTRAVENOUS

## 2014-02-09 MED ORDER — ZOLPIDEM TARTRATE 5 MG PO TABS: 5.0000 mg | ORAL_TABLET | Freq: Every evening | ORAL | Status: DC | PRN

## 2014-02-09 MED ORDER — TERBUTALINE SULFATE 1 MG/ML IJ SOLN: 0.2500 mg | Freq: Once | INTRAMUSCULAR | Status: DC | PRN

## 2014-02-09 MED ORDER — CITRIC ACID-SODIUM CITRATE 334-500 MG/5ML PO SOLN: 30.0000 mL | ORAL | Status: DC | PRN

## 2014-02-09 MED ORDER — LIDOCAINE HCL (PF) 1 % IJ SOLN
INTRAMUSCULAR | Status: DC | PRN
  Administered 2014-02-09 (×2): 4 mL

## 2014-02-09 MED ORDER — LACTATED RINGERS IV SOLN
INTRAVENOUS | Status: DC
Start: 2014-02-09 — End: 2014-02-10
  Administered 2014-02-09 (×2): via INTRAVENOUS

## 2014-02-09 MED ORDER — PRENATAL MULTIVITAMIN CH
1.0000 | ORAL_TABLET | Freq: Every day | ORAL | Status: DC
  Administered 2014-02-10: 1 via ORAL
  Filled 2014-02-09: qty 1

## 2014-02-09 MED ORDER — LIDOCAINE HCL (PF) 1 % IJ SOLN
30.0000 mL | INTRAMUSCULAR | Status: DC | PRN
  Filled 2014-02-09: qty 30

## 2014-02-09 MED ORDER — IBUPROFEN 600 MG PO TABS
600.0000 mg | ORAL_TABLET | Freq: Four times a day (QID) | ORAL | Status: DC
  Administered 2014-02-09 – 2014-02-10 (×5): 600 mg via ORAL
  Filled 2014-02-09 (×5): qty 1

## 2014-02-09 MED ORDER — DIBUCAINE 1 % RE OINT: 1.0000 "application " | TOPICAL_OINTMENT | RECTAL | Status: DC | PRN

## 2014-02-09 NOTE — Anesthesia Preprocedure Evaluation (Addendum)
Anesthesia Evaluation  Patient identified by MRN, date of birth, ID band Patient awake    Reviewed: Allergy & Precautions, H&P , NPO status , Patient's Chart, lab work & pertinent test results  History of Anesthesia Complications (+) Family history of anesthesia reaction and history of anesthetic complications (father has history of muscular dystrophy, she has never had any issues with anesthesia)  Airway Mallampati: II  TM Distance: >3 FB Neck ROM: Full    Dental no notable dental hx. (+) Dental Advisory Given   Pulmonary former smoker,  breath sounds clear to auscultation  Pulmonary exam normal       Cardiovascular hypertension (pregnancy induced hypertension), Rhythm:Regular Rate:Normal     Neuro/Psych negative neurological ROS  negative psych ROS   GI/Hepatic negative GI ROS, Neg liver ROS,   Endo/Other  negative endocrine ROS  Renal/GU negative Renal ROS  negative genitourinary   Musculoskeletal negative musculoskeletal ROS (+)   Abdominal   Peds negative pediatric ROS (+)  Hematology negative hematology ROS (+)   Anesthesia Other Findings   Reproductive/Obstetrics (+) Pregnancy                            Anesthesia Physical Anesthesia Plan  ASA: II  Anesthesia Plan: Epidural   Post-op Pain Management:    Induction:   Airway Management Planned:   Additional Equipment:   Intra-op Plan:   Post-operative Plan:   Informed Consent: I have reviewed the patients History and Physical, chart, labs and discussed the procedure including the risks, benefits and alternatives for the proposed anesthesia with the patient or authorized representative who has indicated his/her understanding and acceptance.   Dental advisory given  Plan Discussed with:   Anesthesia Plan Comments:         Anesthesia Quick Evaluation

## 2014-02-09 NOTE — H&P (Signed)
April CoasterStephanie Bolton is a 28 y.o.MW G2P1 at 8039 1/[redacted] weeks EGA  female. She is here for a requested IOL due to a change in insurance (She will lose her insurance on 02-17-13). History OB History    Gravida Para Term Preterm AB TAB SAB Ectopic Multiple Living   2 1 1       1      Past Medical History  Diagnosis Date  . Endometriosis   . Ovarian cyst   . Preterm labor   . Pregnancy induced hypertension 2011   Past Surgical History  Procedure Laterality Date  . Laparoscopy  2008  . Appendectomy  2005  . Hernia repair  2005    inguinal  . Brachial cleft cyst  2006  . Wisdom tooth extraction  2006   Family History: family history includes Cancer in her mother; Diabetes in her father; Heart disease in her father; Hypertension in her father. Social History:  reports that she quit smoking about 5 years ago. Her smoking use included Cigarettes. She has a 1.2 pack-year smoking history. She has never used smokeless tobacco. She reports that she does not drink alcohol or use illicit drugs.   Prenatal Transfer Tool  Maternal Diabetes: No Genetic Screening: Normal Maternal Ultrasounds/Referrals: Normal Fetal Ultrasounds or other Referrals:  None Maternal Substance Abuse:  No Significant Maternal Medications:  None Significant Maternal Lab Results:  Lab values include: Group B Strep negative Other Comments:  None  ROS    Temperature 98.1 F (36.7 C), temperature source Oral, resp. rate 20, last menstrual period 05/01/2013. Exam Physical Exam  Heart- rrr Lungs- CTAB Abd- benign, gravid EFW - 7 pounds Prenatal labs: ABO, Rh: O/POS/-- (05/12 1542) Antibody: NEG (05/12 1542) Rubella: 0.85 (05/12 1542) RPR: NON REAC (10/05 0917)  HBsAg: NEGATIVE (05/12 1542)  HIV: NONREACTIVE (10/05 0917)  GBS: Negative (12/17 0000)   Assessment/Plan: 39 1/[redacted] weeks EGA with favorable cervix for requested IOL.  I will expect SVD.   April Bolton C. 02/09/2014, 10:01 AM

## 2014-02-09 NOTE — Anesthesia Procedure Notes (Signed)
Epidural Patient location during procedure: OB Start time: 02/09/2014 1:30 PM End time: 02/09/2014 1:35 PM  Staffing Anesthesiologist: Karie SchwalbeJUDD, Ramzi Brathwaite JENNETTE Performed by: anesthesiologist   Preanesthetic Checklist Completed: patient identified, site marked, surgical consent, pre-op evaluation, timeout performed, IV checked, risks and benefits discussed and monitors and equipment checked  Epidural Patient position: sitting Prep: site prepped and draped and DuraPrep Patient monitoring: continuous pulse ox and blood pressure Approach: midline Location: L3-L4 Injection technique: LOR saline  Needle:  Needle type: Tuohy  Needle gauge: 17 G Needle length: 9 cm and 9 Needle insertion depth: 8 cm Catheter type: closed end flexible Catheter size: 19 Gauge Catheter at skin depth: 13 cm Test dose: negative  Assessment Events: blood not aspirated, injection not painful, no injection resistance, negative IV test and no paresthesia

## 2014-02-09 NOTE — Lactation Note (Signed)
This note was copied from the chart of April Almira CoasterStephanie Demarinis. Lactation Consultation Note  Experienced BF mother reports that BF is going well.  Taught hand expression and cue based feeding.  Informed of support groups and outpatient services.    Patient Name: April Bolton ZOXWR'UToday's Date: 02/09/2014     Maternal Data Has patient been taught Hand Expression?: Yes Does the patient have breastfeeding experience prior to this delivery?: Yes  Feeding Feeding Type: Breast Fed Length of feed: 10 min  LATCH Score/Interventions Latch: Grasps breast easily, tongue down, lips flanged, rhythmical sucking.  Audible Swallowing: A few with stimulation  Type of Nipple: Everted at rest and after stimulation  Comfort (Breast/Nipple): Soft / non-tender     Hold (Positioning): No assistance needed to correctly position infant at breast.  LATCH Score: 9  Lactation Tools Discussed/Used Date initiated:: 02/10/14   Consult Status Consult Status: Follow-up Date: 02/10/14 Follow-up type: In-patient    Soyla DryerJoseph, Jermia Rigsby 02/09/2014, 9:54 PM

## 2014-02-10 MED ORDER — MEASLES, MUMPS & RUBELLA VAC ~~LOC~~ INJ
0.5000 mL | INJECTION | Freq: Once | SUBCUTANEOUS | Status: DC
  Filled 2014-02-10: qty 0.5

## 2014-02-10 MED ORDER — IBUPROFEN 600 MG PO TABS: 600.0000 mg | ORAL_TABLET | Freq: Four times a day (QID) | ORAL | Status: DC | PRN

## 2014-02-10 NOTE — Discharge Summary (Signed)
Obstetric Discharge Summary Reason for Admission: induction of labor Prenatal Procedures: treatment for PTL Intrapartum Procedures: spontaneous vaginal delivery Postpartum Procedures: none Complications-Operative and Postpartum: none HEMOGLOBIN  Date Value Ref Range Status  02/09/2014 10.9* 12.0 - 15.0 g/dL Final   HCT  Date Value Ref Range Status  02/09/2014 33.6* 36.0 - 46.0 % Final    Physical Exam:  General: alert Lochia: appropriate Uterine Fundus: firm @ U-2 Incision: n/a DVT Evaluation: No evidence of DVT seen on physical exam.  Discharge Diagnoses: Term Pregnancy-delivered  Discharge Information: Date: 02/10/2014 Activity: pelvic rest Diet: routine Medications: Ibuprofen Condition: stable Instructions: refer to practice specific booklet Discharge to: home Follow-up Information    Follow up with Deontaye Civello C., MD. Schedule an appointment as soon as possible for a visit in 6 weeks.   Specialty:  Obstetrics and Gynecology   Contact information:   [address] [place] [id number] [phone]       Newborn Data: Live born female  Birth Weight: 7 lb 4.6 oz (3305 g) APGAR: 9, 9  Home with mother.  April Kirby C. 02/10/2014, 6:53 AM

## 2014-02-10 NOTE — Anesthesia Postprocedure Evaluation (Signed)
  Anesthesia Post-op Note  Anesthesia Post Note  Patient: April Bolton  Procedure(s) Performed: * No procedures listed *  Anesthesia type: Epidural  Patient location: Mother/Baby  Post pain: Pain level controlled  Post assessment: Post-op Vital signs reviewed  Last Vitals:  Filed Vitals:   02/10/14 0636  BP: 105/47  Pulse: 79  Temp:   Resp: 20    Post vital signs: Reviewed  Level of consciousness:alert  Complications: No apparent anesthesia complications

## 2014-02-11 ENCOUNTER — Ambulatory Visit: Payer: Self-pay

## 2014-02-11 NOTE — Lactation Note (Signed)
This note was copied from the chart of April Almira CoasterStephanie Davies. Lactation Consultation Note  7.4% weight loss.  Using #24NS.  Stools transitioning.  Mother's nipples cracked tender. Parents state bf has improved and are viewing breastmilk in NS after feeding. Offered to view next feeding but parents want to be discharged.  Offered outpt appt, mother states they will call if needed. Provided parents with extra #24NS.  Mother has comfort gels. Reviewed engorgement care and encouraged parents to call if further assistance is needed.  Patient Name: April Bolton [id number]'s Date: 02/11/2014 Reason for consult: Follow-up assessment   Maternal Data    Feeding Feeding Type: Breast Fed Length of feed: 15 min  LATCH Score/Interventions                      Lactation Tools Discussed/Used     Consult Status Consult Status: Complete    Hardie PulleyBerkelhammer, Ruth Boschen 02/11/2014, 9:13 AM

## 2014-02-12 ENCOUNTER — Encounter: Payer: 59 | Admitting: Advanced Practice Midwife

## 2014-03-21 ENCOUNTER — Ambulatory Visit (INDEPENDENT_AMBULATORY_CARE_PROVIDER_SITE_OTHER): Payer: 59 | Admitting: Obstetrics & Gynecology

## 2014-03-21 ENCOUNTER — Encounter: Payer: Self-pay | Admitting: Obstetrics & Gynecology

## 2014-03-21 NOTE — Progress Notes (Signed)
  Subjective:     April CoasterStephanie Kelemen is a 29 y.o. MW P232 ( 256 week old daughter and 604 yo son)  female who presents for a postpartum visit. She is 6 weeks postpartum following a spontaneous vaginal delivery. I have fully reviewed the prenatal and intrapartum course. The delivery was at 39 gestational weeks. Outcome: spontaneous vaginal delivery. Anesthesia: epidural. Postpartum course has been normal. Baby's course has been good, growing well. Baby is feeding by breast. Bleeding brown. Bowel function is "a little constipation". She knows how to treat this.. Bladder function is normal. Patient is not sexually active. Contraception method is condoms. Postpartum depression screening: negative.  The following portions of the patient's history were reviewed and updated as appropriate: allergies, current medications, past family history, past medical history, past social history, past surgical history and problem list.  Review of Systems Pertinent items are noted in HPI.   Objective:    BP 99/71 mmHg  Pulse 77  Resp 16  Ht 5\' 2"  (1.575 m)  Wt 148 lb (67.132 kg)  BMI 27.06 kg/m2  Breastfeeding? Yes  General:  alert and cooperative   Breasts:  inspection negative, no nipple discharge or bleeding, no masses or nodularity palpable  Lungs: clear to auscultation bilaterally  Heart:  regular rate and rhythm, S1, S2 normal, no murmur, click, rub or gallop  Abdomen: soft, non-tender; bowel sounds normal; no masses,  no organomegaly   Vulva:  normal  Vagina: not evaluated  Cervix:  not evaluated  Corpus: not examined  Adnexa:  not evaluated  Rectal Exam: Not performed.        Assessment:     Normal postpartum exam. Pap smear not done at today's visit. Pap was normal with negative HPV on 3/15  Plan:    1. Contraception: condoms RTC prn

## 2014-10-15 ENCOUNTER — Ambulatory Visit (INDEPENDENT_AMBULATORY_CARE_PROVIDER_SITE_OTHER): Payer: 59 | Admitting: Obstetrics & Gynecology

## 2014-10-15 ENCOUNTER — Encounter: Payer: Self-pay | Admitting: Obstetrics & Gynecology

## 2014-10-15 VITALS — BP 97/64 | HR 83 | Resp 16 | Ht 62.0 in | Wt 113.0 lb

## 2014-10-15 DIAGNOSIS — N9489 Other specified conditions associated with female genital organs and menstrual cycle: Secondary | ICD-10-CM

## 2014-10-15 DIAGNOSIS — N898 Other specified noninflammatory disorders of vagina: Secondary | ICD-10-CM | POA: Diagnosis not present

## 2014-10-15 NOTE — Progress Notes (Signed)
   Subjective:    Patient ID: April Bolton, female    DOB: September 23, 1985, 29 y.o.   MRN: [id number]  HPI  29 year old female presents for evaluation of the vaginal wall cyst. She was told that she had this during her pregnancy. She has not seen it until recently. Is now become large enough that her prolapses down.  The patient denies any pain bleeding pressure from her urethra dysuria or any pelvic complaints. She is curious as to what the mass  Review of Systems  Constitutional: Negative.   Respiratory: Negative.   Cardiovascular: Negative.   Gastrointestinal: Negative.   Genitourinary: Negative for dysuria, vaginal bleeding, vaginal discharge, vaginal pain, menstrual problem and dyspareunia.       Objective:   Physical Exam  Constitutional: She is oriented to person, place, and time. She appears well-developed and well-nourished. No distress.  HENT:  Head: Normocephalic and atraumatic.  Eyes: Conjunctivae are normal.  Pulmonary/Chest: Effort normal.  Abdominal: Soft. Bowel sounds are normal. She exhibits no distension and no mass. There is no tenderness. There is no rebound and no guarding.  Genitourinary: Uterus normal.    No vaginal discharge found.  Musculoskeletal: She exhibits no edema.  Neurological: She is alert and oriented to person, place, and time.  Skin: Skin is warm and dry.  Psychiatric: She has a normal mood and affect.  Vitals reviewed.         Assessment & Plan:  29 yo female with vaginal cyst under the urethra near the meatus. 1-Referal to urology to r/u urethral diverticulum 2-if symptoms if simple vaginal cyst, could be drained in the office.

## 2014-10-16 ENCOUNTER — Telehealth: Payer: Self-pay

## 2014-10-16 NOTE — Telephone Encounter (Signed)
ERROR

## 2014-11-28 ENCOUNTER — Telehealth: Payer: Self-pay

## 2014-11-28 NOTE — Telephone Encounter (Signed)
Due to patient insurance, pt need to get approval from primary care doctor in order to see urology doctor. Pt is aware and will select pcp and try to get authorization once she get established.

## 2015-03-01 ENCOUNTER — Other Ambulatory Visit: Payer: Self-pay | Admitting: Urology

## 2015-03-01 DIAGNOSIS — N361 Urethral diverticulum: Secondary | ICD-10-CM

## 2015-03-20 ENCOUNTER — Ambulatory Visit (HOSPITAL_COMMUNITY): Payer: BLUE CROSS/BLUE SHIELD

## 2015-04-24 IMAGING — US US OB DETAIL+14 WK
1 series · 12 of 28 positions shown · non-contrast
Comparison: none

OBSTETRICS REPORT
                      (Signed Final 09/19/2013 [DATE])

Service(s) Provided
 US OB DETAIL + 14 WK                                  76811.0
Indications
 Basic anatomic survey
Fetal Evaluation
 Num Of Fetuses:    1
 Fetal Heart Rate:  144                          bpm
 Cardiac Activity:  Observed
 Presentation:      Breech
 Placenta:          Posterior Fundal, above
                    cervical os
 P. Cord            Visualized, central
 Insertion:
 Amniotic Fluid
 AFI FV:      Subjectively within normal limits
                                             Larg Pckt:     4.9  cm
Biometry
 BPD:     43.9  mm     G. Age:  19w 2d                CI:        76.52   70 - 86
                                                      FL/HC:      18.6   16.1 -
 HC:       159  mm     G. Age:  18w 6d       25  %    HC/AC:      1.11   1.09 -
 AC:     143.1  mm     G. Age:  19w 4d       62  %    FL/BPD:
 FL:      29.5  mm     G. Age:  19w 1d       42  %    FL/AC:      20.6   20 - 24
 HUM:     28.9  mm     G. Age:  19w 3d       57  %
 CER:     17.7  mm     G. Age:  17w 5d       12  %
 NFT:     3.13  mm
 Est. FW:     286  gm    0 lb 10 oz      49  %
Gestational Age
 LMP:           20w 1d        Date:  05/01/13                 EDD:   02/05/14
 U/S Today:     19w 2d                                        EDD:   02/11/14
 Best:          19w 1d     Det. By:  Early Ultrasound         EDD:   02/12/14
                                     (06/27/13)
Anatomy
 Cranium:          Appears normal         Aortic Arch:      Appears normal
 Fetal Cavum:      Appears normal         Ductal Arch:      Appears normal
 Ventricles:       Appears normal         Diaphragm:        Appears normal
 Choroid Plexus:   Bilateral choroid      Stomach:          Appears normal, left
                   plexus cysts
                                                            sided
 Cerebellum:       Appears normal         Abdomen:          Appears normal
 Posterior Fossa:  Appears normal         Abdominal Wall:   Appears nml (cord
                                                            insert, abd wall)
 Nuchal Fold:      Appears normal         Cord Vessels:     Appears normal (3
                                                            vessel cord)
 Face:             Appears normal         Kidneys:          Appear normal
                   (orbits and profile)
 Lips:             Appears normal         Bladder:          Appears normal
 Palate:           Appears normal         Spine:            Not well visualized
 Heart:            Appears normal         Lower             Appears normal
                   (4CH, axis, and        Extremities:
                   situs)
 RVOT:             Appears normal         Upper             Appears normal
                                          Extremities:
 LVOT:             Appears normal
 Other:  Nasal bone visualized. Heels and 5th digit visualized. Technically
         difficult due to fetal position. Female gender.
Targeted Anatomy
 Fetal Central Nervous System
 Cisterna Magna:
Cervix Uterus Adnexa
 Cervical Length:    4.87     cm
 Cervix:       Normal appearance by transabdominal scan.
 Left Ovary:    Within normal limits.
 Right Ovary:   Within normal limits.
Impression
INDICATION: 28 yr old [id number] at 86w8d for fetal anatomic
 survey.

[Series 1: us ob comp +14 wk · 110 acquisitions, 12 frames shown]
[im 5/110]
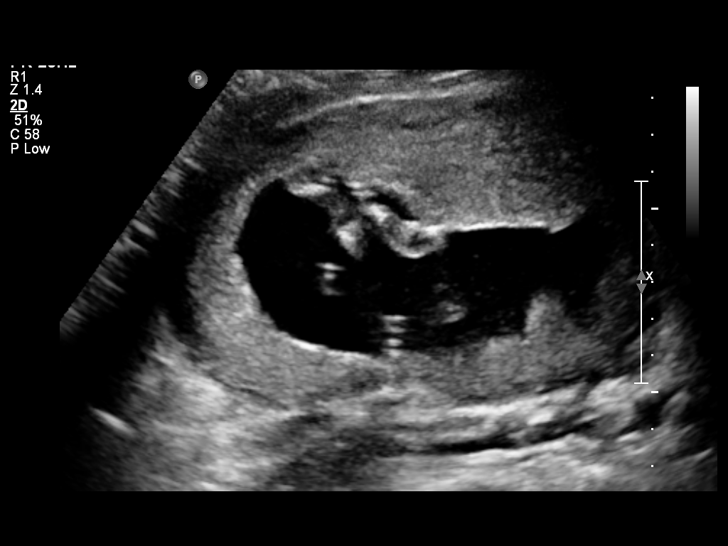
[im 13/110]
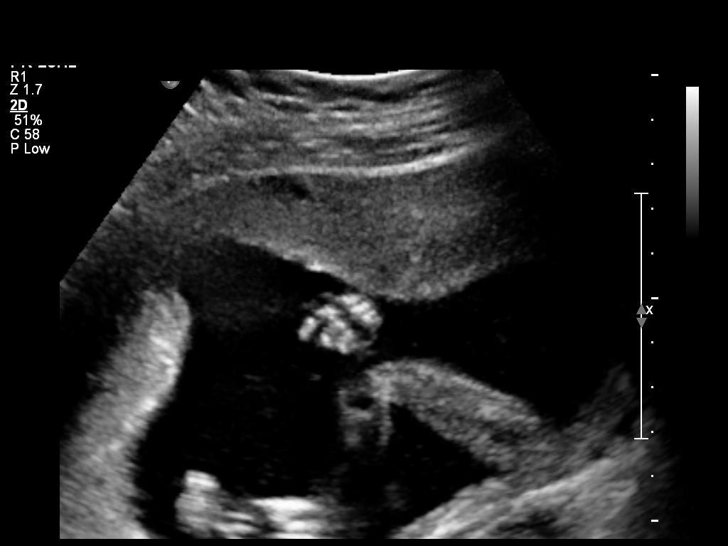
[im 21/110]
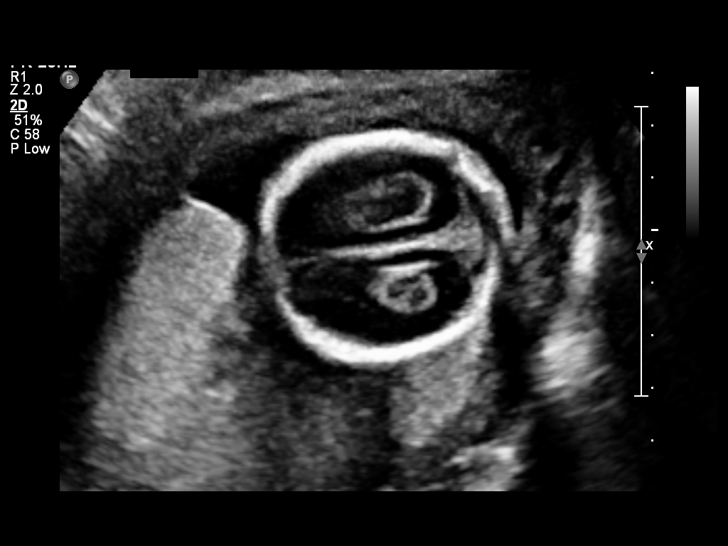
[im 33/110]
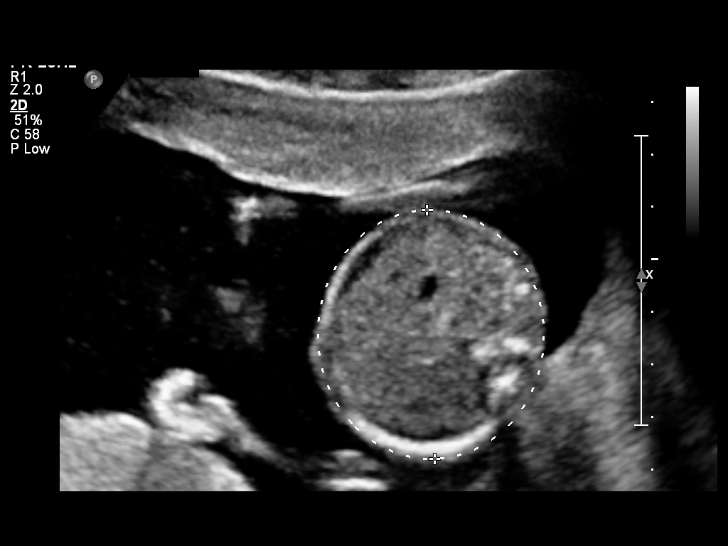
[im 41/110]
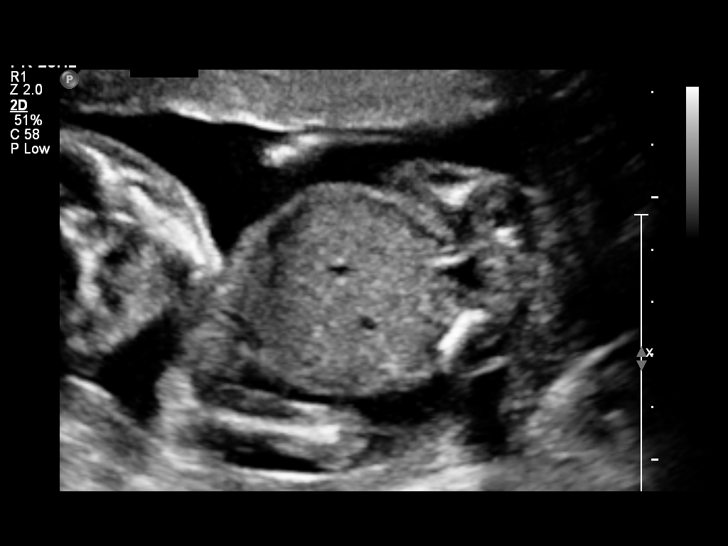
[im 49/110]
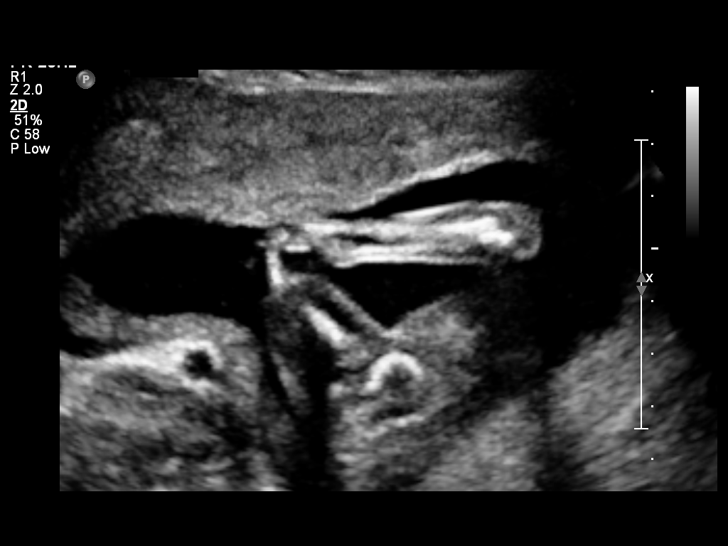
[im 61/110]
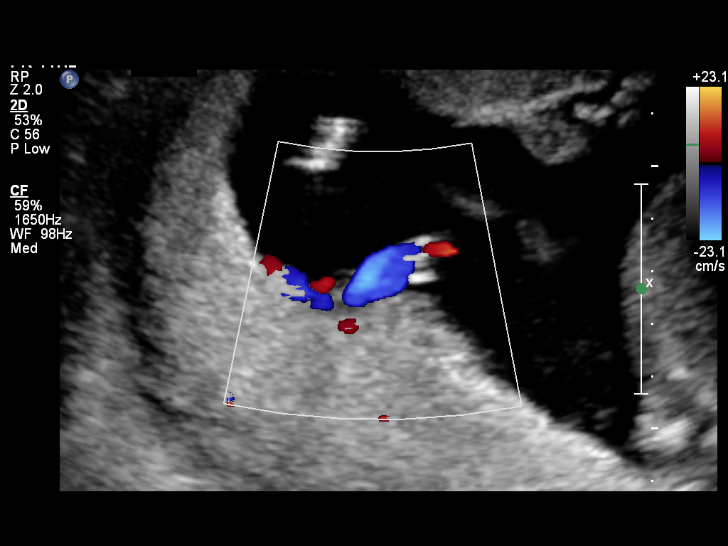
[im 69/110]
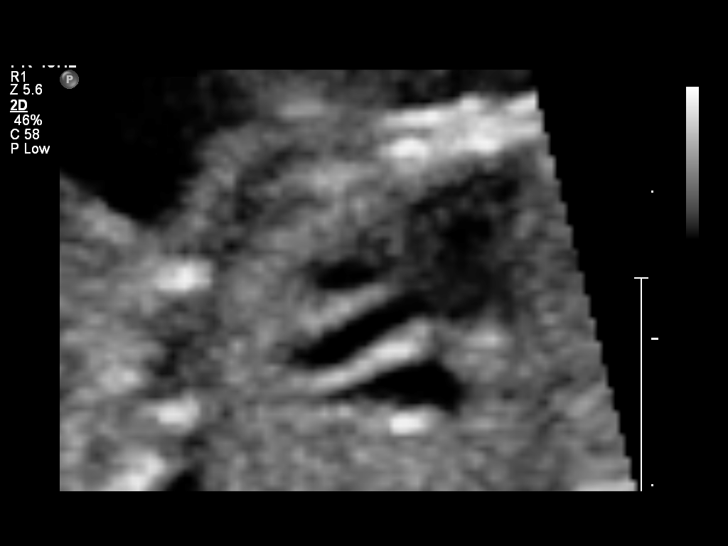
[im 77/110]
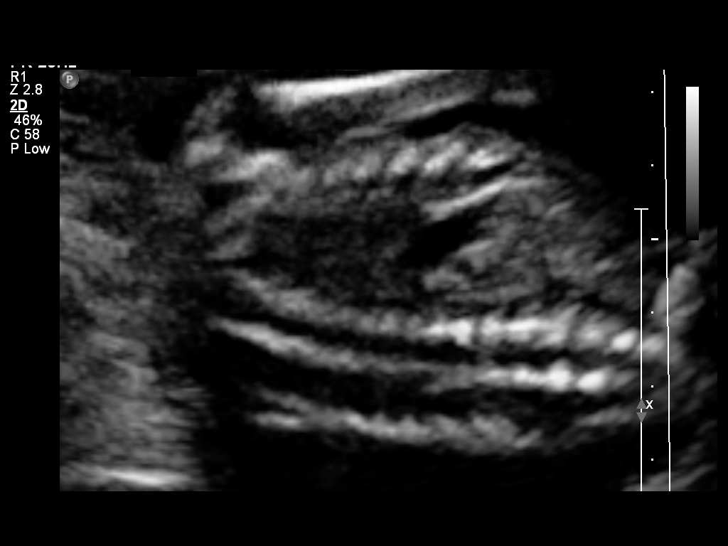
[im 89/110]
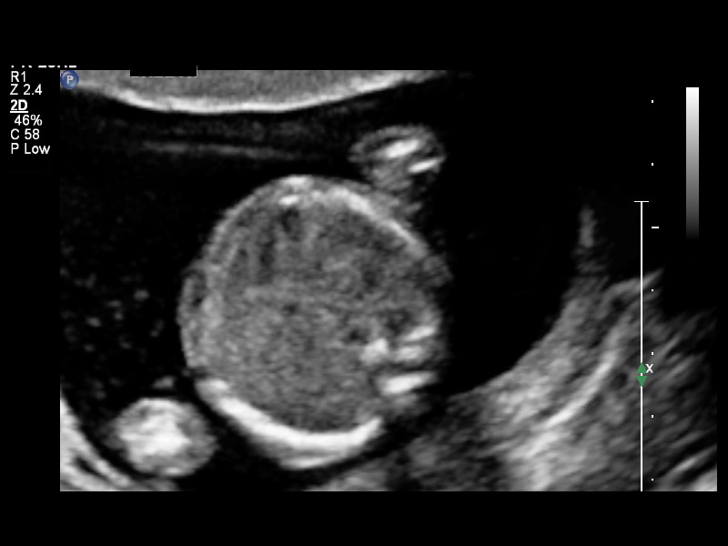
[im 97/110]
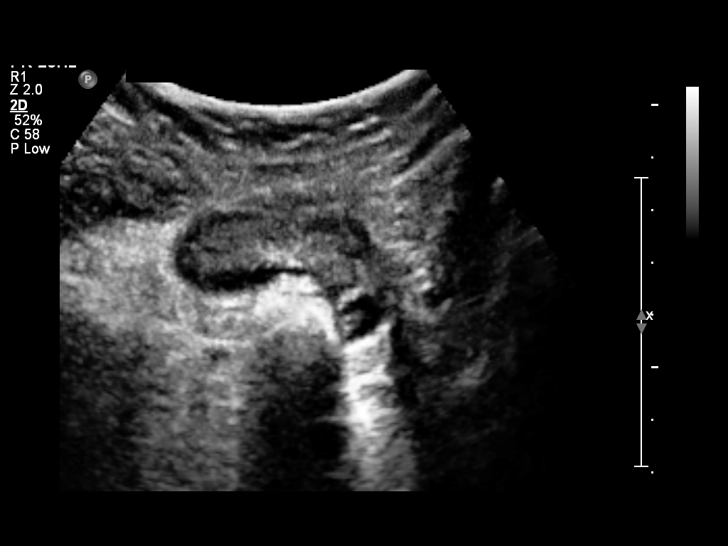
[im 105/110]
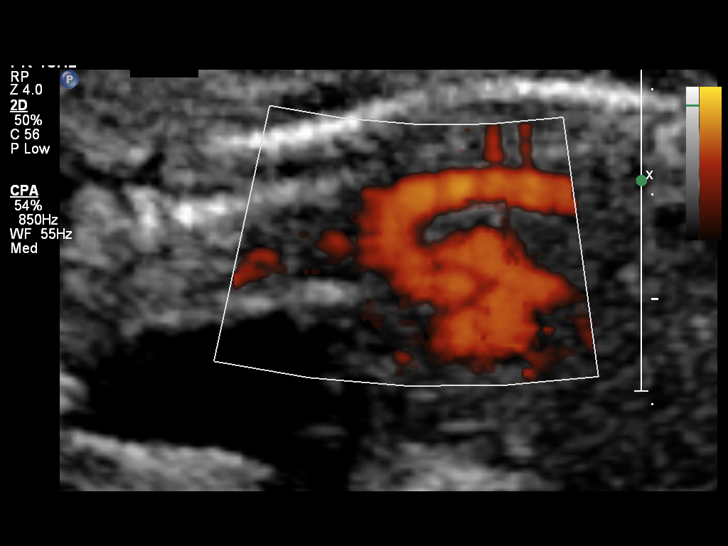

[12 of 28 positions shown; findings below may reference images not displayed]

FINDINGS: 1. Single intrauterine pregnancy.
 2. Fetal biometry is consistent with dating.
 3. Posterior placenta without evidence of previa.
 4. Normal amniotic fluid volume.
 5. Normal transabdominal cervical length.
 6. There are bilateral choroid plexus cysts.
 7. The views of the spine are limited.
 8. The remainder of the limited anatomy survey is normal.
Recommendations

 1. Appropriate fetal growth.
 2. Limited anatomy survey:
 - recommend follow up in 3-4 weeks to complete anatomic
 survey
 3. Choroid plexus cysts:
 - patient met with the genetic counselor; see separate report
 - after counseling patient opted for the quad screen which
 was drawn today; declined amniocentesis and cell free fetal
 DNA

 questions or concerns.

## 2015-05-13 ENCOUNTER — Ambulatory Visit (HOSPITAL_COMMUNITY): Admission: RE | Admit: 2015-05-13 | Payer: BLUE CROSS/BLUE SHIELD | Source: Ambulatory Visit

## 2015-05-21 IMAGING — US US OB FOLLOW-UP
1 series · 12 of 28 positions shown · non-contrast
Comparison: none

[Series 1: us ob follow-up · 0.19mm/px · 12 of 68 slices shown]
[im 3/68]
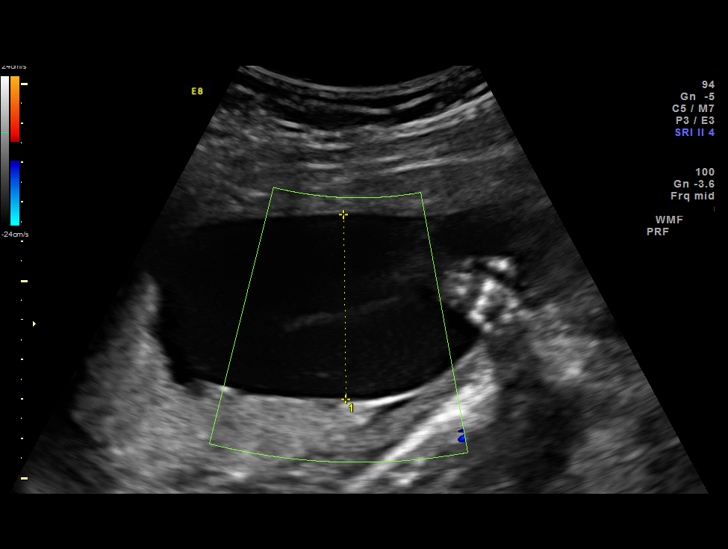
[im 8/68]
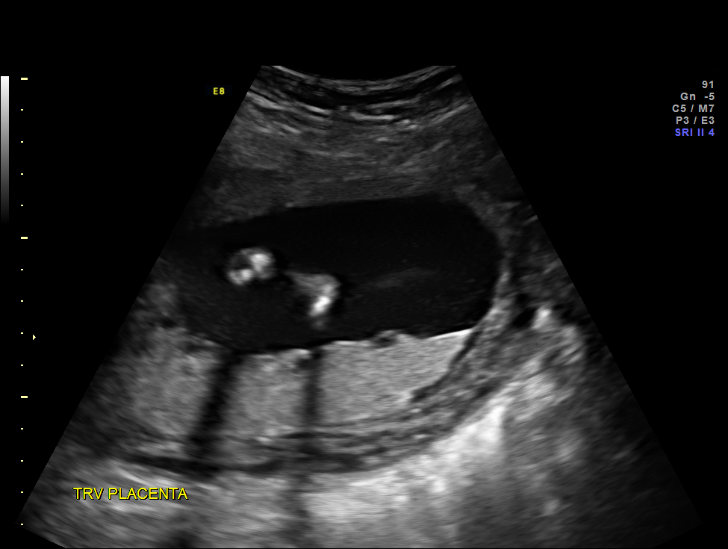
[im 13/68]
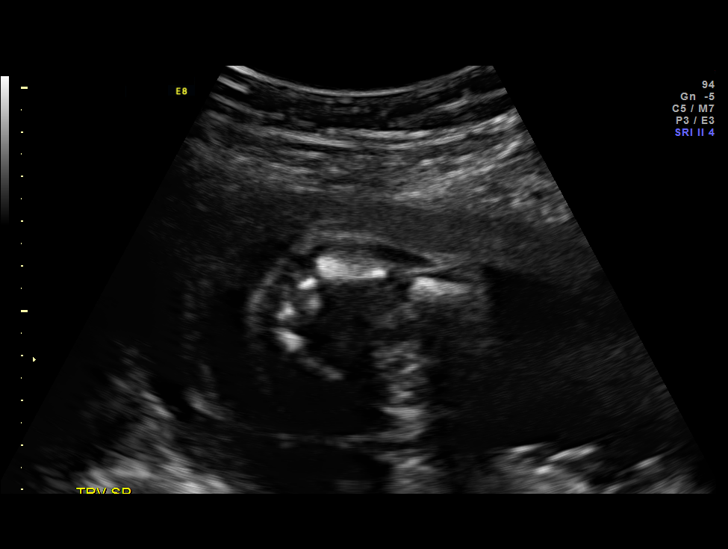
[im 20/68]
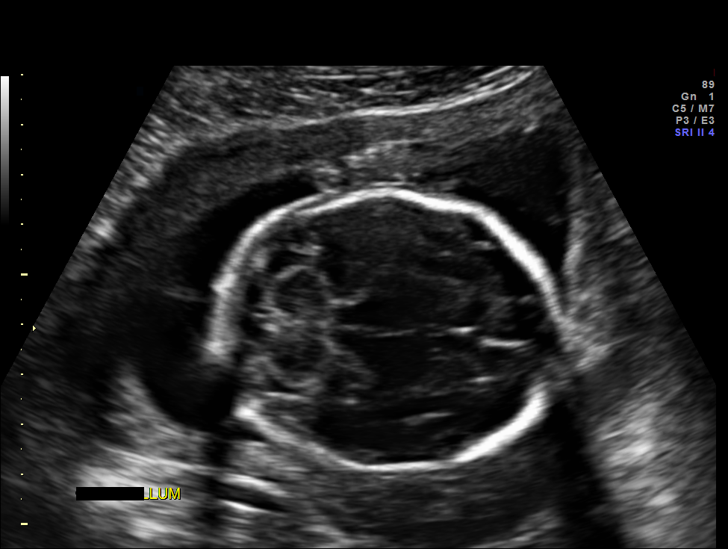
[im 25/68]
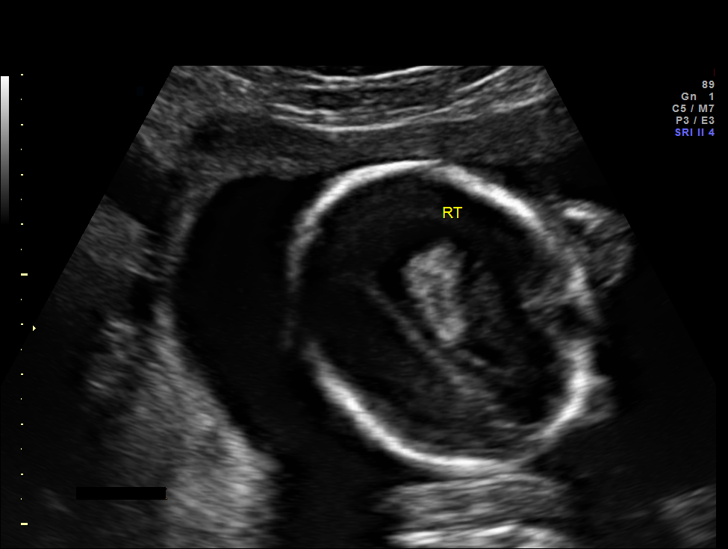
[im 30/68]
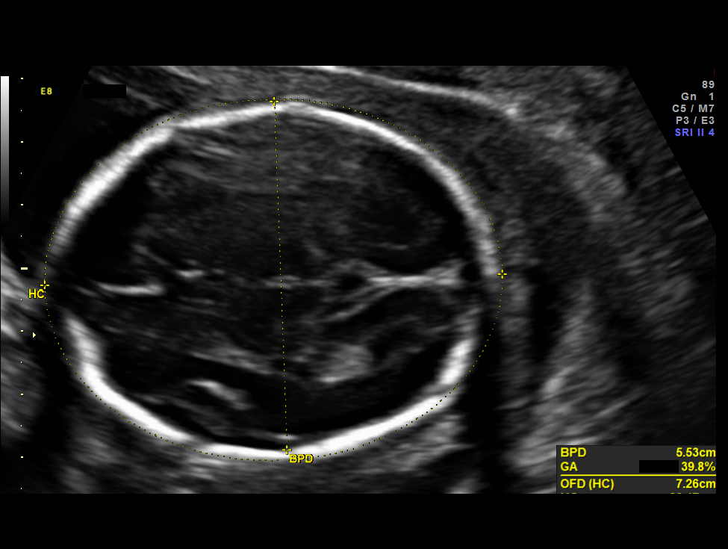
[im 38/68]
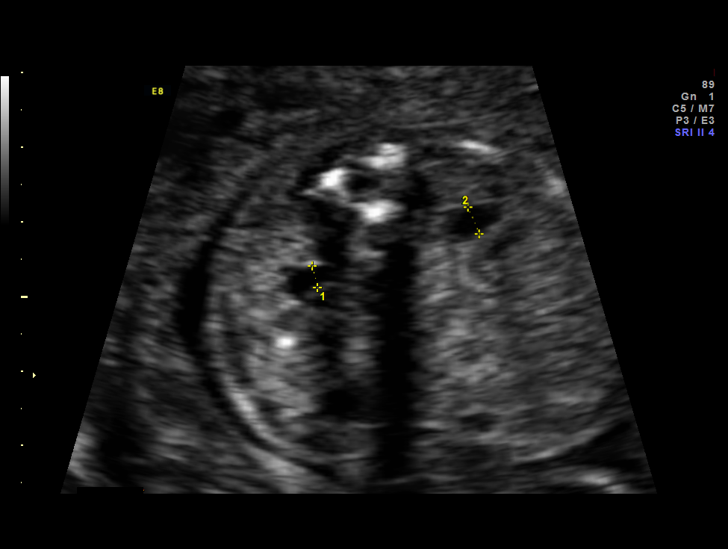
[im 43/68]
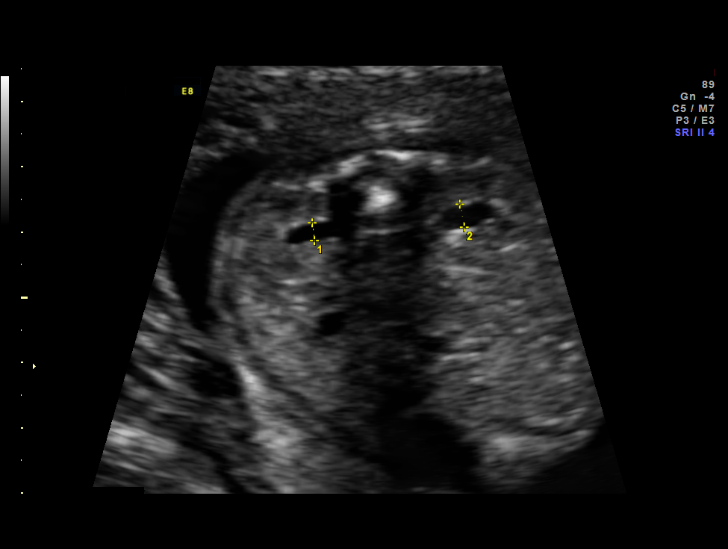
[im 48/68]
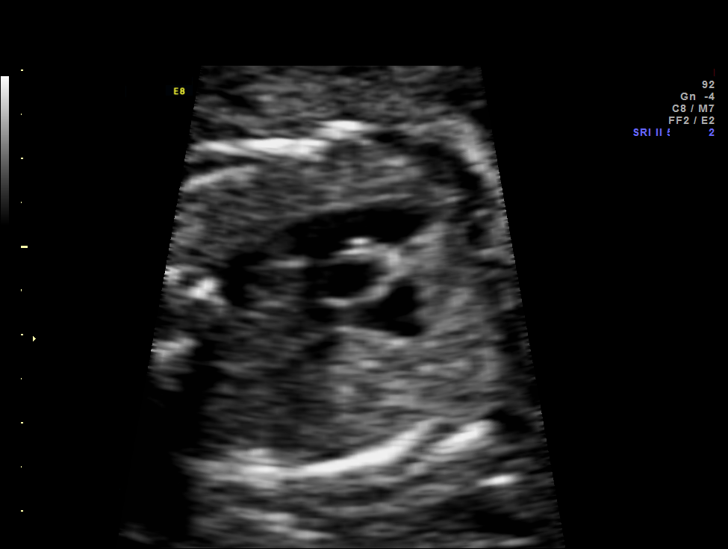
[im 55/68]
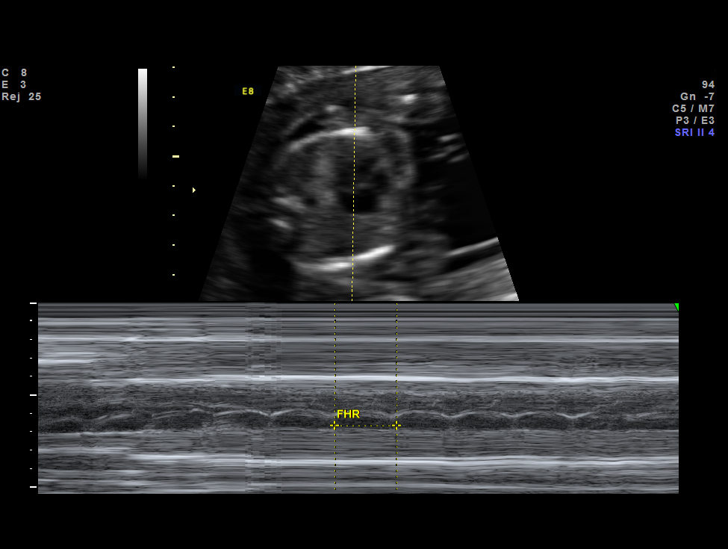
[im 60/68]
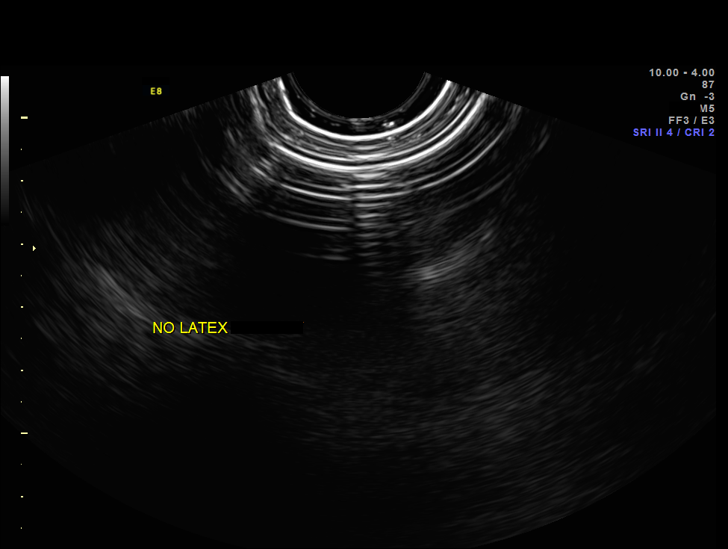
[im 65/68]
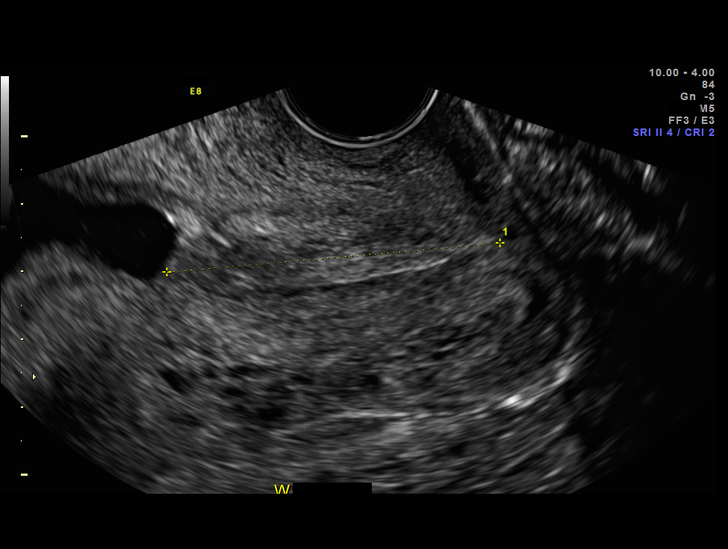

[12 of 28 positions shown; findings below may reference images not displayed]

OBSTETRICS REPORT
                      (Signed Final 10/16/2013 [DATE])

Service(s) Provided

 US OB FOLLOW UP                                       76816.1
 US MFM OB TRANSVAGINAL                                76817.2
Indications

 Follow-up incomplete fetal anatomic evaluation
 Choroid plexus cyst - bilateral
 Poor obstetric history (prior preterm contractions;
 delivered at term)
Fetal Evaluation

 Num Of Fetuses:    1
 Fetal Heart Rate:  163                          bpm
 Cardiac Activity:  Observed
 Presentation:      Cephalic
 Placenta:          Posterior, above cervical
                    os
 P. Cord            Visualized, central
 Insertion:

 Amniotic Fluid
 AFI FV:      Subjectively within normal limits
                                             Larg Pckt:    4.69  cm
Biometry

 BPD:     55.6  mm     G. Age:  23w 0d                CI:         77.8   70 - 86
 OFD:     71.5  mm                                    FL/HC:      19.6   19.2 -

 HC:     204.2  mm     G. Age:  22w 4d       20  %    HC/AC:      1.05   1.05 -

 AC:     194.6  mm     G. Age:  24w 1d       77  %    FL/BPD:     72.1   71 - 87
 FL:      40.1  mm     G. Age:  23w 0d       37  %    FL/AC:      20.6   20 - 24
 HUM:     35.7  mm     G. Age:  22w 3d       29  %

 Est. FW:     600  gm      1 lb 5 oz     61  %
Gestational Age

 LMP:           24w 0d        Date:  05/01/13                 EDD:   02/05/14
 U/S Today:     23w 1d                                        EDD:   02/11/14
 Best:          23w 0d     Det. By:  Early Ultrasound         EDD:   02/12/14
                                     (06/27/13)
Anatomy

 Cranium:          Appears normal         Aortic Arch:      Appears normal
 Fetal Cavum:      Appears normal         Ductal Arch:      Previously seen
 Ventricles:       Appears normal         Diaphragm:        Previously seen
 Choroid Plexus:   Appears normal;        Stomach:          Appears normal, left
                   CPCs resolved                            sided
 Cerebellum:       Appears normal         Abdomen:          Appears normal
 Posterior Fossa:  Appears normal         Abdominal Wall:   Appears nml (cord
                                                            insert, abd wall)
 Nuchal Fold:      Previously seen        Cord Vessels:     Appears normal (3
                                                            vessel cord)
 Face:             Appears normal         Kidneys:          Appear normal
                   (orbits and profile)
 Lips:             Appears normal         Bladder:          Appears normal
 Palate:           Previously seen        Spine:            Appears normal
 Heart:            Appears normal         Lower             Previously seen
                   (4CH, axis, and        Extremities:
                   situs)
 RVOT:             Appears normal         Upper             Previously seen
                                          Extremities:
 LVOT:             Appears normal

 Other:  Nasal bone visualized. Heels and 5th digit previously visualized.
         Technically difficult due to fetal position. Female gender.
Cervix Uterus Adnexa

 Cervical Length:    4.6      cm

 Cervix:       Normal appearance by transvaginal scan
 Uterus:       No abnormality visualized.
 Cul De Sac:   No free fluid seen.
 Left Ovary:    Within normal limits.
 Right Ovary:   Within normal limits.
 Adnexa:     No abnormality visualized.
Impression

 SIUP at 23+0 weeks
 Normal interval anatomy; anatomic survey complete; CPCs
 resovled
 Normal amniotic fluid volume
 Appropriate interval growth with EFW at the 61st %tile
 EV views of cervix: normal length without funneling

 Low risk quad screen
Recommendations

 Follow-up ultrasounds as clinically indicated.

 questions or concerns.

## 2016-09-29 ENCOUNTER — Ambulatory Visit: Payer: BLUE CROSS/BLUE SHIELD | Admitting: Obstetrics & Gynecology

## 2016-11-23 ENCOUNTER — Other Ambulatory Visit (INDEPENDENT_AMBULATORY_CARE_PROVIDER_SITE_OTHER): Payer: BLUE CROSS/BLUE SHIELD

## 2016-11-23 DIAGNOSIS — R309 Painful micturition, unspecified: Secondary | ICD-10-CM

## 2016-11-23 LAB — POCT URINALYSIS DIPSTICK
BILIRUBIN UA: NEGATIVE
Blood, UA: NEGATIVE
Glucose, UA: NEGATIVE
KETONES UA: NEGATIVE
LEUKOCYTES UA: NEGATIVE
Nitrite, UA: NEGATIVE
Protein, UA: NEGATIVE
Spec Grav, UA: 1.01 (ref 1.010–1.025)
Urobilinogen, UA: 0.2 E.U./dL
pH, UA: 5 (ref 5.0–8.0)

## 2016-11-23 NOTE — Progress Notes (Addendum)
Pt states that she is "feeling pressure near her bladder" and she has had this symptom before and had a UTI. I dipped the urine and it was normal. Will send it out for culture. Told pt if results are negative from culture then we will take it from there and she may need to make an appt to see a doctor.

## 2016-11-25 LAB — URINE CULTURE
MICRO NUMBER:: 81117839
SPECIMEN QUALITY: ADEQUATE

## 2016-11-27 ENCOUNTER — Telehealth: Payer: Self-pay

## 2016-11-27 NOTE — Telephone Encounter (Signed)
Spoke with pt and she is aware that the results from her urine culture came back normal.

## 2016-12-24 ENCOUNTER — Encounter: Payer: BLUE CROSS/BLUE SHIELD | Admitting: Obstetrics & Gynecology

## 2016-12-28 ENCOUNTER — Encounter: Payer: BLUE CROSS/BLUE SHIELD | Admitting: Advanced Practice Midwife

## 2017-07-21 ENCOUNTER — Encounter: Payer: Self-pay | Admitting: Obstetrics & Gynecology

## 2017-07-21 ENCOUNTER — Ambulatory Visit: Payer: BLUE CROSS/BLUE SHIELD | Admitting: Obstetrics & Gynecology

## 2017-07-21 VITALS — BP 111/69 | HR 78 | Resp 16 | Ht 62.0 in | Wt 148.0 lb

## 2017-07-21 DIAGNOSIS — Z124 Encounter for screening for malignant neoplasm of cervix: Secondary | ICD-10-CM | POA: Diagnosis not present

## 2017-07-21 DIAGNOSIS — N644 Mastodynia: Secondary | ICD-10-CM

## 2017-07-21 DIAGNOSIS — Z0000 Encounter for general adult medical examination without abnormal findings: Secondary | ICD-10-CM

## 2017-07-21 DIAGNOSIS — Z1151 Encounter for screening for human papillomavirus (HPV): Secondary | ICD-10-CM

## 2017-07-21 NOTE — Progress Notes (Signed)
   Subjective:    Patient ID: April Bolton, female    DOB: 10/01/1985, 32 y.o.   MRN: [id number]  HPI 32 yo married P2 here with right breast pain in the outer side of the breast. This has been going on for several months. At first it seemed to be coming about a week before her period, would last a day or so, but also has come at other times. IBU doesn't make it go away.   She denies a fh of breast cancer, denies nipple discharge, skin changes, or breast lumps.  Review of Systems She is using condoms for contraception. Her husband is planning a vasectomy.    Objective:   Physical Exam Breathing, conversing, and ambulating normally Well nourished, well hydrated White female, no apparent distress Completely normal exam of both breasts No lymphadenopathy Cervix appears normal as does vulva and vagina     Assessment & Plan:  Breast pain- rec Midol, I also offered a mammogram but doubt that this is cancer related If pain no better, I have also offered a referral to gen surg Preventative care- pap smear done today.

## 2017-07-22 LAB — CYTOLOGY - PAP
Diagnosis: NEGATIVE
HPV: NOT DETECTED

## 2017-12-13 ENCOUNTER — Encounter: Payer: Self-pay | Admitting: Obstetrics & Gynecology

## 2017-12-13 ENCOUNTER — Ambulatory Visit: Payer: BLUE CROSS/BLUE SHIELD | Admitting: Obstetrics & Gynecology

## 2017-12-13 VITALS — BP 126/78 | HR 91 | Ht 62.0 in | Wt 145.0 lb

## 2017-12-13 DIAGNOSIS — M545 Low back pain, unspecified: Secondary | ICD-10-CM

## 2017-12-13 DIAGNOSIS — R102 Pelvic and perineal pain: Secondary | ICD-10-CM

## 2017-12-13 LAB — POCT URINALYSIS DIPSTICK
Bilirubin, UA: NEGATIVE
Glucose, UA: NEGATIVE
Ketones, UA: NEGATIVE
NITRITE UA: NEGATIVE
PROTEIN UA: NEGATIVE
Spec Grav, UA: 1.025 (ref 1.010–1.025)
Urobilinogen, UA: 0.2 E.U./dL
pH, UA: 5 (ref 5.0–8.0)

## 2017-12-13 MED ORDER — LEVONORGEST-ETH ESTRAD 91-DAY 0.15-0.03 &0.01 MG PO TABS: 1.0000 | ORAL_TABLET | Freq: Every day | ORAL | 4 refills | Status: DC

## 2017-12-13 NOTE — Progress Notes (Signed)
Pt c/o pelvic pain and pressure and lower back pain

## 2017-12-13 NOTE — Progress Notes (Signed)
Patient ID: April Bolton, female   DOB: Jan 06, 1986, 32 y.o.   MRN: [id number]  Chief Complaint  Patient presents with  . Pelvic Pain    HPI April Bolton is a 32 y.o. female.  Married P2 (4 and 8 yo kids) here with a return of her endometriosis pain. She had a laparoscopy and removal of endo in [place] in about 2007. She is currently using condoms for contraception. The dysmenorrhea as progressively worsened over the last 6 months. She tried a Mirena in the the distant past (about 2007) but this did not help. She has used the OCPs in the past with some relief.     Past Medical History:  Diagnosis Date  . Endometriosis   . Ovarian cyst   . Pregnancy induced hypertension 2011  . Preterm labor     Past Surgical History:  Procedure Laterality Date  . APPENDECTOMY  2005  . brachial cleft cyst  2006  . HERNIA REPAIR  2005   inguinal  . LAPAROSCOPY  2008  . WISDOM TOOTH EXTRACTION  2006    Family History  Problem Relation Age of Onset  . Cancer Mother        cervical  . Diabetes Father   . Heart disease Father   . Hypertension Father     Social History Social History   Tobacco Use  . Smoking status: Former Smoker    Packs/day: 0.30    Years: 4.00    Pack years: 1.20    Types: Cigarettes    Last attempt to quit: 12/26/2008    Years since quitting: 8.9  . Smokeless tobacco: Never Used  Substance Use Topics  . Alcohol use: No    Comment: Not since Pregnancy  . Drug use: No    Allergies  Allergen Reactions  . Brethine [Terbutaline] Other (See Comments)    Syncope and BP bottoms out  . Sulfa Antibiotics Rash    Current Outpatient Medications  Medication Sig Dispense Refill  . Multiple Vitamin (MULTIVITAMIN) tablet Take 1 tablet by mouth daily.    Marland Kitchen ibuprofen (ADVIL,MOTRIN) 600 MG tablet Take 1 tablet (600 mg total) by mouth every 6 (six) hours as needed. (Patient not taking: Reported on 12/13/2017) 60 tablet 1   No current facility-administered medications  for this visit.     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  Physical Exam Physical Exam\ Breathing, conversing, and ambulating normally Well nourished, well hydrated White female, no apparent distress Abd- benign Pelvic- + pelvic fullness, boggy uterus 8-10 week size  Data Reviewed Pap 6/19 normal  Assessment    Pelvic pain/endometriosis    Plan   Gyn u/s Start extended cycle April Bolton Come back 4 months/prn sooner If no better, change to [employer] 12/13/2017, 11:07 AM

## 2017-12-15 LAB — URINE CULTURE

## 2017-12-16 ENCOUNTER — Ambulatory Visit (INDEPENDENT_AMBULATORY_CARE_PROVIDER_SITE_OTHER): Payer: BLUE CROSS/BLUE SHIELD

## 2017-12-16 DIAGNOSIS — R102 Pelvic and perineal pain: Secondary | ICD-10-CM | POA: Diagnosis not present

## 2017-12-27 ENCOUNTER — Ambulatory Visit: Payer: BLUE CROSS/BLUE SHIELD | Admitting: Obstetrics & Gynecology

## 2017-12-27 ENCOUNTER — Encounter: Payer: Self-pay | Admitting: Obstetrics & Gynecology

## 2017-12-27 VITALS — BP 120/78 | HR 80 | Ht 62.0 in | Wt 146.0 lb

## 2017-12-27 DIAGNOSIS — N809 Endometriosis, unspecified: Secondary | ICD-10-CM | POA: Diagnosis not present

## 2017-12-27 DIAGNOSIS — N920 Excessive and frequent menstruation with regular cycle: Secondary | ICD-10-CM | POA: Diagnosis not present

## 2017-12-27 NOTE — Progress Notes (Signed)
   Subjective:    Patient ID: April Bolton, female    DOB: 27-Aug-1985, 32 y.o.   MRN: [id number]  HPI  32 yo female presents for f/u from [place] for pelvic pain.  Pt has started OCPs to help with pain and bleeding (menorrhagia).  Pt has known endometriosis (on the 3 month cycle pack).  Patient does not usually have spotting but since starting the oral contraceptives, she has had a little bit of spotting which can be expected.  Patient is willing to undergo trial of oral contraceptives for the endometriosis and menorrhagia.  Patient has mild constipation.  Patient has no urinary complaints.  Review of Systems  Constitutional: Negative.   Respiratory: Negative.   Cardiovascular: Negative.   Gastrointestinal: Positive for constipation.  Genitourinary: Positive for menstrual problem and pelvic pain.  Musculoskeletal: Negative.   Psychiatric/Behavioral: Negative.        Objective:   Physical Exam  Constitutional: She is oriented to person, place, and time. She appears well-developed and well-nourished. No distress.  HENT:  Head: Normocephalic and atraumatic.  Eyes: Conjunctivae are normal.  Cardiovascular: Normal rate.  Pulmonary/Chest: Effort normal.  Musculoskeletal: She exhibits no edema.  Neurological: She is alert and oriented to person, place, and time.  Skin: Skin is warm and dry.  Psychiatric: She has a normal mood and affect.  Vitals reviewed.  Vitals:   12/27/17 1422  BP: 120/78  Pulse: 80  Weight: 146 lb (66.2 kg)  Height: 5\' 2"  (1.575 m)   FINDINGS: Uterus  Measurements: 10.2 x 5.7 x 7.0 cm. No fibroids or other mass visualized.  Endometrium  Thickness: 17 mm in thickness. Small amount of fluid within the endometrial canal.  Right ovary  Measurements: 2.4 x 2.0 x 2.2 cm. Normal appearance/no adnexal mass.  Left ovary  Measurements: 3.5 x 2.1 x 2.2 cm. Normal appearance/no adnexal mass.  Other findings:  No abnormal free  fluid  IMPRESSION: Endometrium upper limits normal in thickness at 17 mm. Small amount of fluid within the endometrial canal.  No acute findings.   Electronically Signed   By: Charlett Nose M.D.   On: 12/16/2017 10:43      Assessment & Plan:  37-year-old female with menorrhagia and pelvic pain from known endometriosis  1.  Continue the birth control pills.  She is on the generic Seasonale Seasonique.  Patient should expect spotting and we discussed this in detail. 2.  If she finds that birth control pills do not control her pain, we can switch her to or Alyssa. 3.  Patient had the ultrasound during her menstrual cycle which explains the fluid in the endometrial canal.  She is continues to spot have menorrhagia, could do an endometrial biopsy to make sure that endometrial is normal. 4.  MiraLAX as needed for constipation.

## 2018-04-18 ENCOUNTER — Ambulatory Visit: Payer: BLUE CROSS/BLUE SHIELD | Admitting: Obstetrics & Gynecology

## 2018-12-20 ENCOUNTER — Ambulatory Visit (INDEPENDENT_AMBULATORY_CARE_PROVIDER_SITE_OTHER): Payer: BC Managed Care – PPO | Admitting: Advanced Practice Midwife

## 2018-12-20 ENCOUNTER — Encounter: Payer: Self-pay | Admitting: Advanced Practice Midwife

## 2018-12-20 ENCOUNTER — Other Ambulatory Visit: Payer: Self-pay

## 2018-12-20 VITALS — BP 122/79 | HR 82 | Ht 62.0 in | Wt 168.0 lb

## 2018-12-20 DIAGNOSIS — R102 Pelvic and perineal pain: Secondary | ICD-10-CM

## 2018-12-20 DIAGNOSIS — G8929 Other chronic pain: Secondary | ICD-10-CM | POA: Diagnosis not present

## 2018-12-20 DIAGNOSIS — N809 Endometriosis, unspecified: Secondary | ICD-10-CM

## 2018-12-20 DIAGNOSIS — R140 Abdominal distension (gaseous): Secondary | ICD-10-CM

## 2018-12-20 MED ORDER — ORILISSA 150 MG PO TABS: 1.0000 | ORAL_TABLET | Freq: Every day | ORAL | 2 refills | Status: DC

## 2018-12-20 NOTE — Progress Notes (Signed)
  GYNECOLOGY PROGRESS NOTE  History:  33 y.o. G2P2002 presents to [facility] office today for problem gyn visit. She reports increased pelvic pain with irregular spotting. She has known endometriosis and was put on OCPs (3 month cycle) about 1 year ago for pain and menorrhagia. She had some spotting when initially starting the medication, but this resolved. For the last 3-4 months she has had increased pelvic pain and spotting in between periods. She reports bloating and denies constipation. She has no urinary complaints.  She denies h/a, dizziness, shortness of breath, n/v, or fever/chills.    The following portions of the patient's history were reviewed and updated as appropriate: allergies, current medications, past family history, past medical history, past social history, past surgical history and problem list. Last pap smear on 07/21/17 was normal, negative HRHPV.  Review of Systems:  Pertinent items are noted in HPI.   Objective:  Physical Exam Blood pressure 122/79, pulse 82, height 5\' 2"  (1.575 m), weight 76.2 kg, last menstrual period 11/29/2018. VS reviewed, nursing note reviewed,  Constitutional: well developed, well nourished, no distress HEENT: normocephalic CV: normal rate Pulm/chest wall: normal effort Breast Exam: deferred Abdomen: soft Neuro: alert and oriented x 3 Skin: warm, dry Psych: affect normal Pelvic exam:  external genitalia normal Bimanual exam: Cervix 0/long/high, firm, anterior, neg CMT, mild uterine tenderness with slight fullness noted. adnexa without tenderness, enlargement, or mass  Assessment & Plan:  1. Endometriosis At last visit with Dr. Gala Romney, she suggested Orilissa if OCP fails to resolve patients symptoms. Discussed this in depth with patient today including but not limited too when to take, mechanism of action, additional need for birth control, side effects, starting dosage. Patient will trial Freida Busman and follow up with MD in 2 months. She will use  condoms as a form of birth control. If side effects are intolerable or pain worsens patient will follow up sooner.  - Elagolix Sodium (ORILISSA) 150 MG TABS; Take 1 tablet by mouth daily.  Dispense: 30 tablet; Refill: 2  2. Chronic pelvic pain in female 3. Abdominal bloating    Annalyn Blecher A Garen Woolbright, Student-PA 4:19 PM

## 2018-12-29 ENCOUNTER — Telehealth: Payer: Self-pay

## 2019-01-02 NOTE — Telephone Encounter (Signed)
error 

## 2019-03-09 ENCOUNTER — Other Ambulatory Visit: Payer: Self-pay

## 2019-03-09 ENCOUNTER — Encounter: Payer: Self-pay | Admitting: Obstetrics & Gynecology

## 2019-03-09 ENCOUNTER — Ambulatory Visit: Payer: Managed Care, Other (non HMO) | Admitting: Obstetrics & Gynecology

## 2019-03-09 VITALS — BP 118/75 | HR 86 | Temp 98.5°F | Resp 16 | Ht 62.0 in | Wt 175.0 lb

## 2019-03-09 DIAGNOSIS — N809 Endometriosis, unspecified: Secondary | ICD-10-CM | POA: Diagnosis not present

## 2019-03-09 NOTE — Progress Notes (Signed)
   Subjective:    Patient ID: April Bolton, female    DOB: 1986/01/27, 34 y.o.   MRN: [id number]  HPI  Pt presents for f/u of pelvic pain and endometriosis.  Patient was started on Orilissa in November and has had no pelvic pain.  She also has not had a.  Has some hot flashes.  The patient uses condoms for birth control.  She is very happy with her pain profile and would like to continue.  Reviewed that she can be on it for 2 years.  She takes calcium and vitamin D to maintain her bone health.  At the end of 2 years we will talk about next steps.  Review of Systems  Constitutional:       Some weight gain recently; beginning to exercise  Gastrointestinal: Negative.   Genitourinary: Negative for dyspareunia, menstrual problem and pelvic pain.  Psychiatric/Behavioral: Negative.        Objective:   Physical Exam Vitals reviewed.  Constitutional:      General: She is not in acute distress.    Appearance: She is well-developed.  HENT:     Head: Normocephalic and atraumatic.  Eyes:     Conjunctiva/sclera: Conjunctivae normal.  Cardiovascular:     Rate and Rhythm: Normal rate.  Pulmonary:     Effort: Pulmonary effort is normal.  Skin:    General: Skin is warm and dry.  Neurological:     Mental Status: She is alert and oriented to person, place, and time.    Vitals:   03/09/19 1314  BP: 118/75  Pulse: 86  Resp: 16  Temp: 98.5 F (36.9 C)  Weight: 175 lb (79.4 kg)  Height: 5\' 2"  (1.575 m)       Assessment & Plan:  34 year old female with endometriosis doing well on Orilissa.  We will continue for up to 2 years.  Patient is due for Pap smear in 2022.

## 2019-05-08 ENCOUNTER — Telehealth: Payer: Self-pay

## 2019-05-08 DIAGNOSIS — N809 Endometriosis, unspecified: Secondary | ICD-10-CM

## 2019-05-08 MED ORDER — ORILISSA 150 MG PO TABS: 1.0000 | ORAL_TABLET | Freq: Every day | ORAL | 2 refills | Status: DC

## 2019-05-08 NOTE — Telephone Encounter (Signed)
Pt called requesting refill of Orlissa. Per Dr.Leggett's note pt can have refill for two years. Refill sent.

## 2019-05-09 ENCOUNTER — Telehealth: Payer: Self-pay

## 2019-05-09 DIAGNOSIS — N809 Endometriosis, unspecified: Secondary | ICD-10-CM

## 2019-05-09 MED ORDER — ORILISSA 150 MG PO TABS: 1.0000 | ORAL_TABLET | Freq: Every day | ORAL | 11 refills | Status: DC

## 2019-05-09 NOTE — Telephone Encounter (Signed)
Pt wants Dewayne Hatch sent as a year supply. Year supply sent.

## 2019-07-11 IMAGING — US US TRANSVAGINAL NON-OB
1 series · 14 of 25 positions shown · non-contrast
Comparison: CT 07/10/2008

CLINICAL DATA: Pelvic pain

EXAM:
ULTRASOUND PELVIS TRANSVAGINAL
TECHNIQUE: Transvaginal ultrasound examination of the pelvis was performed
including evaluation of the uterus, ovaries, adnexal regions, and
pelvic cul-de-sac.

[Series 1: us transvaginal non-ob · 0.15mm/px · 14 of 79 slices shown]
[im 1/79]
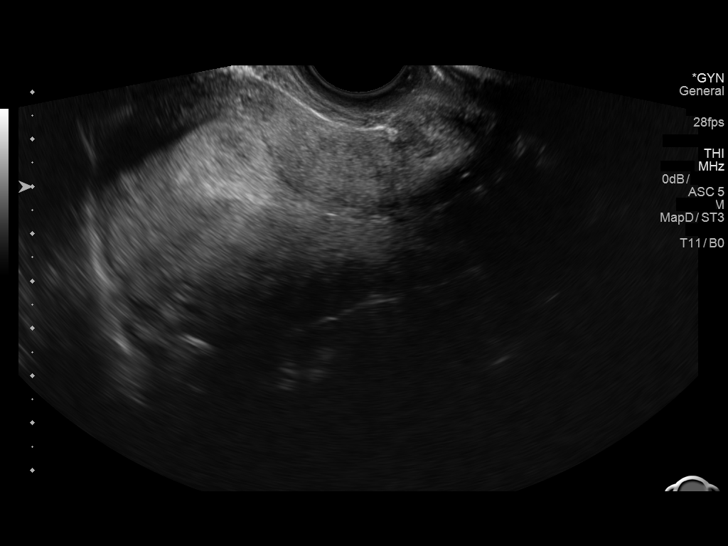
[im 7/79]
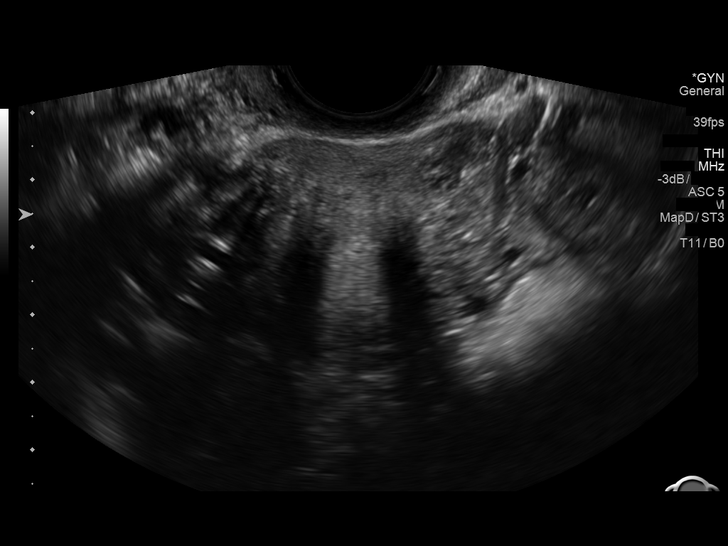
[im 14/79]
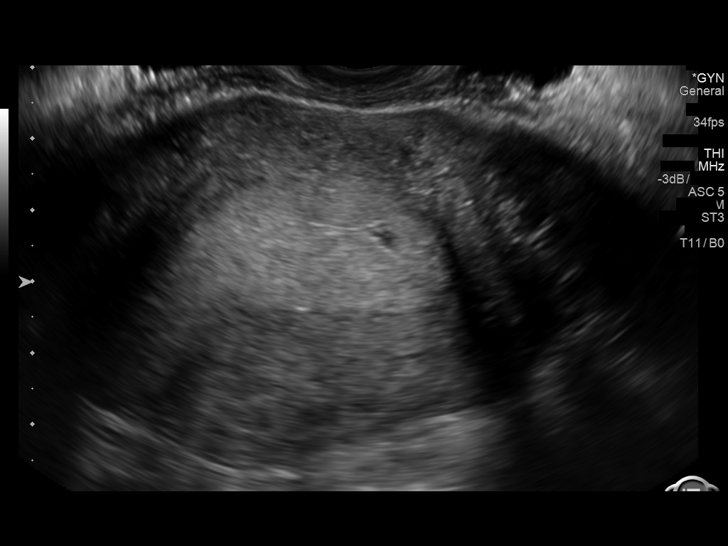
[im 20/79]
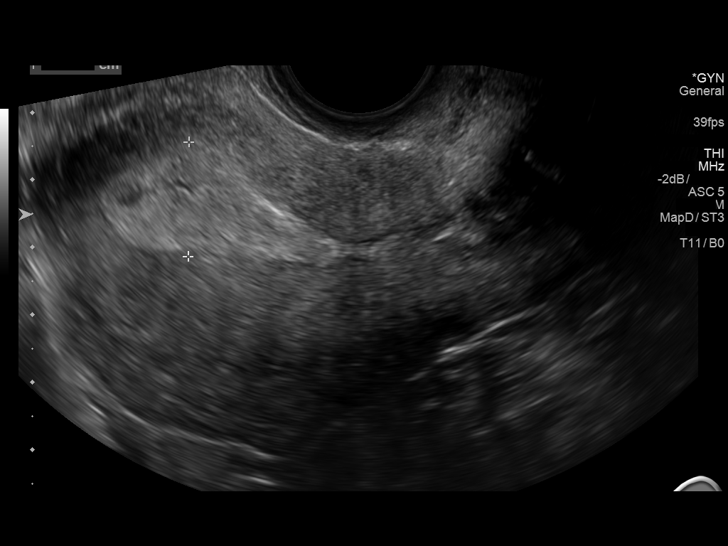
[im 27/79]
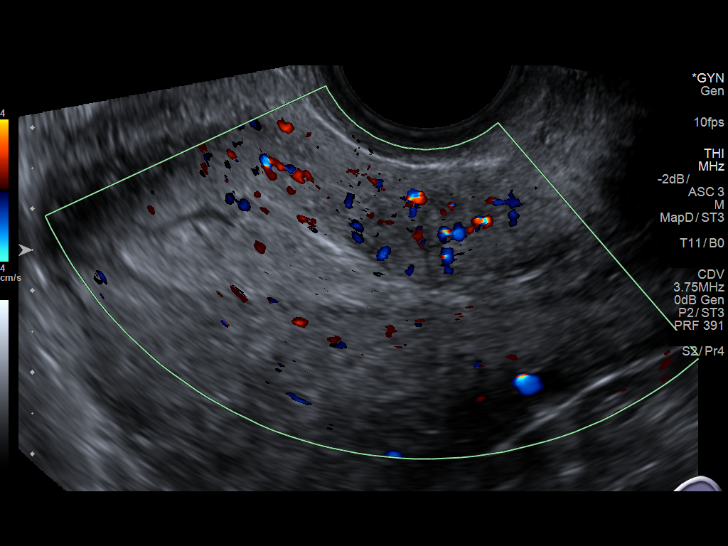
[im 30/79]
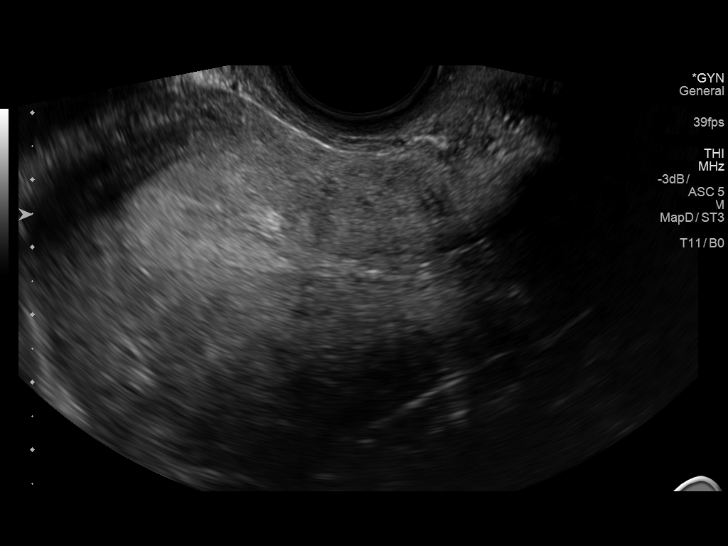
[im 36/79]
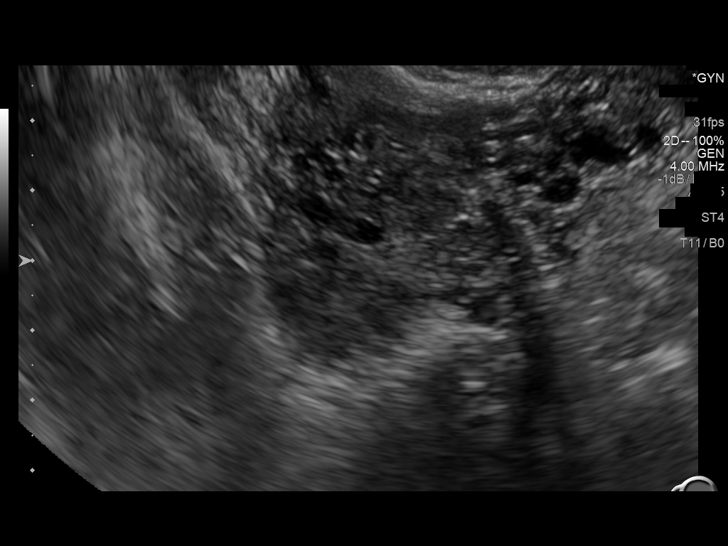
[im 43/79]
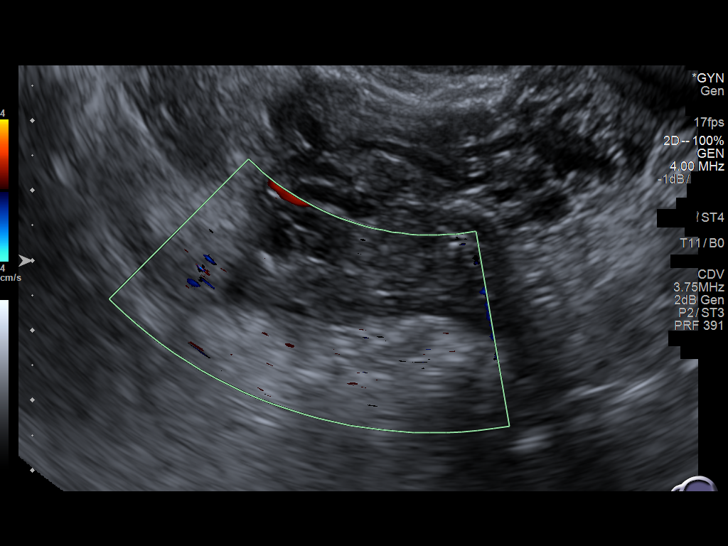
[im 49/79]
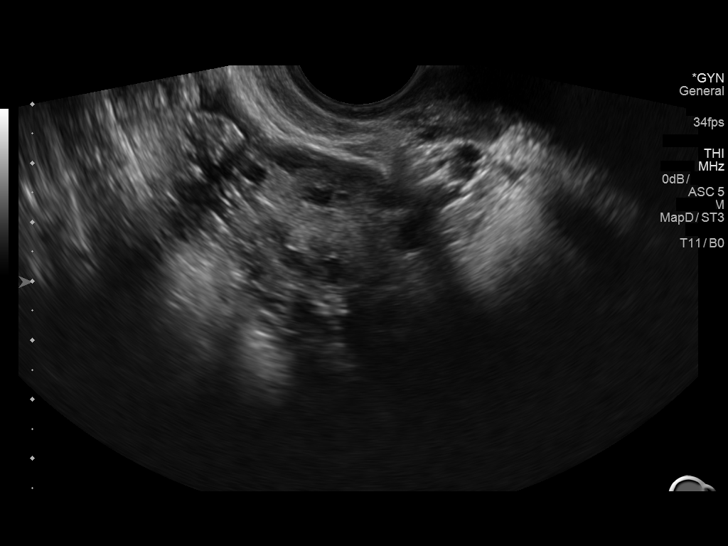
[im 53/79]
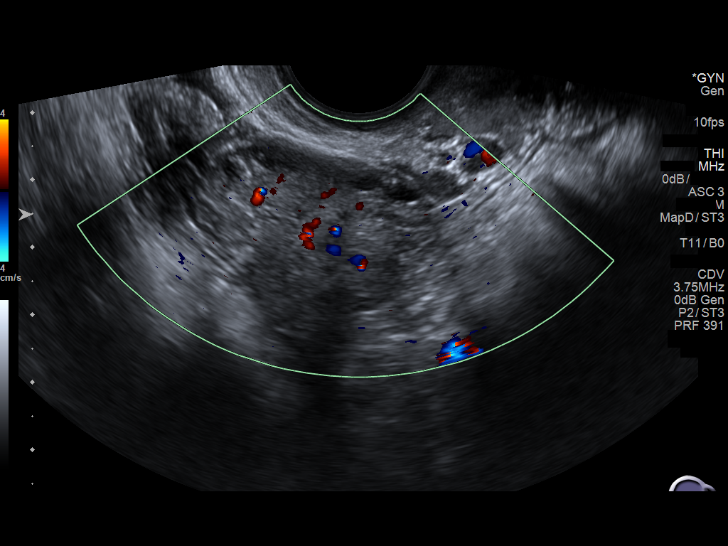
[im 59/79]
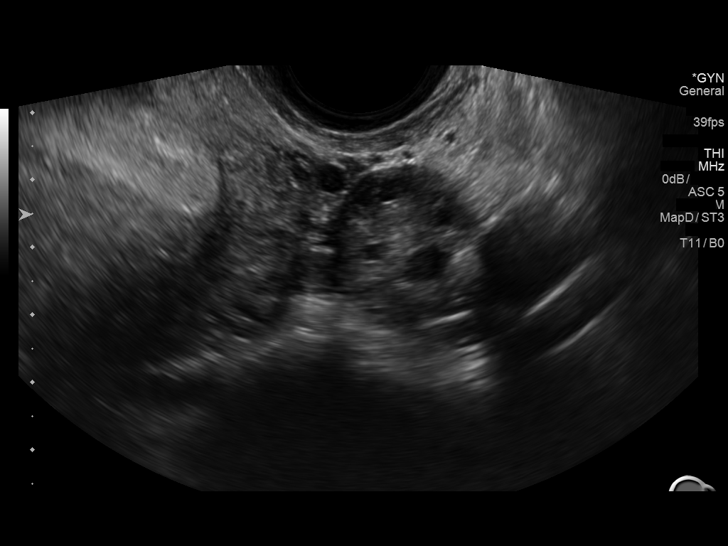
[im 66/79]
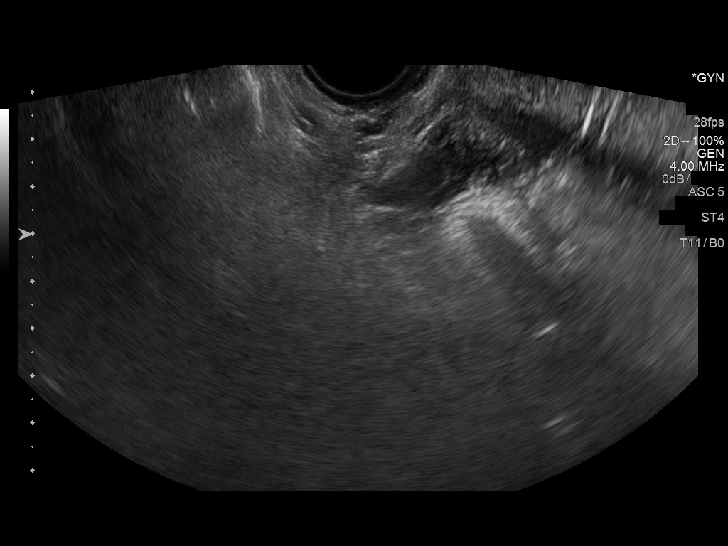
[im 72/79]
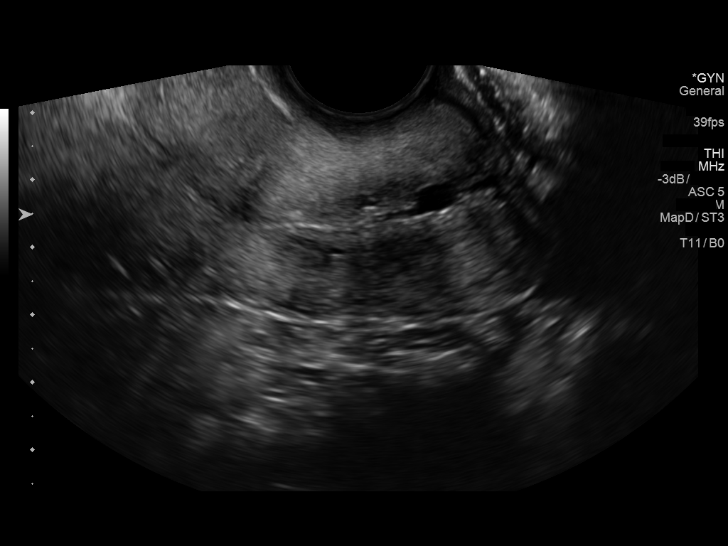
[im 79/79]
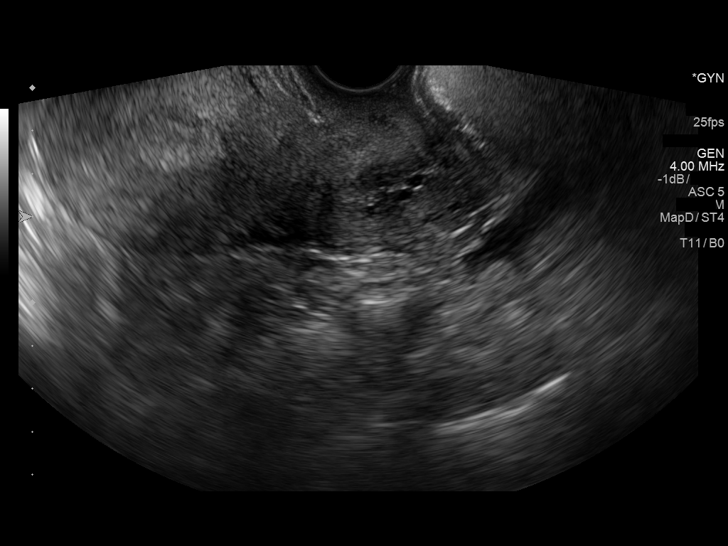

[14 of 25 positions shown; findings below may reference images not displayed]

FINDINGS: Uterus

Measurements: 10.2 x 5.7 x 7.0 cm. No fibroids or other mass
visualized.

Endometrium

Thickness: 17 mm in thickness. Small amount of fluid within the
endometrial canal..

Right ovary

Measurements: 2.4 x 2.0 x 2.2 cm. Normal appearance/no adnexal mass.

Left ovary

Measurements: 3.5 x 2.1 x 2.2 cm. Normal appearance/no adnexal mass.

Other findings:  No abnormal free fluid
IMPRESSION: Endometrium upper limits normal in thickness at 17 mm. Small amount
of fluid within the endometrial canal.

No acute findings.

## 2019-07-20 ENCOUNTER — Other Ambulatory Visit: Payer: Self-pay

## 2019-07-20 ENCOUNTER — Other Ambulatory Visit (HOSPITAL_COMMUNITY)
Admission: RE | Admit: 2019-07-20 | Discharge: 2019-07-20 | Disposition: A | Discharge status: Home / Self Care | Payer: No Typology Code available for payment source | Source: Ambulatory Visit | Attending: Obstetrics and Gynecology | Admitting: Obstetrics and Gynecology

## 2019-07-20 ENCOUNTER — Ambulatory Visit (INDEPENDENT_AMBULATORY_CARE_PROVIDER_SITE_OTHER): Payer: No Typology Code available for payment source | Admitting: Obstetrics and Gynecology

## 2019-07-20 ENCOUNTER — Encounter: Payer: Self-pay | Admitting: Obstetrics and Gynecology

## 2019-07-20 VITALS — BP 118/75 | HR 77 | Resp 16 | Ht 62.0 in | Wt 174.0 lb

## 2019-07-20 DIAGNOSIS — Z3202 Encounter for pregnancy test, result negative: Secondary | ICD-10-CM | POA: Diagnosis not present

## 2019-07-20 DIAGNOSIS — R102 Pelvic and perineal pain: Secondary | ICD-10-CM | POA: Diagnosis present

## 2019-07-20 DIAGNOSIS — O26899 Other specified pregnancy related conditions, unspecified trimester: Secondary | ICD-10-CM | POA: Diagnosis present

## 2019-07-20 LAB — POCT URINE PREGNANCY: Preg Test, Ur: NEGATIVE

## 2019-07-20 NOTE — Progress Notes (Signed)
GYNECOLOGY OFFICE FOLLOW UP NOTE  History:  34 y.o. [id number] here today for follow up for pelvic pain for about a week. Has been on Or[place]issa ~8 months and very happy with it. Last week, started having spotting, then heavier bleeding like a period and spotting has lightened. Started having severe, diffuse lower abdominal cramping, in pelvis and back that started with period and has not abated. Not taken anything for pain for it. Has not had issues like this since starting [place].    Denies fever, chills, nausea, vomiting. Having regular bowel movements, last this am. Denies other complaints.   Past Medical History:  Diagnosis Date  . Endometriosis   . Ovarian cyst   . Pregnancy induced hypertension 2011  . Preterm labor     Past Surgical History:  Procedure Laterality Date  . APPENDECTOMY  2005  . brachial cleft cyst  2006  . HERNIA REPAIR  2005   inguinal  . LAPAROSCOPY  2008  . WISDOM TOOTH EXTRACTION  2006     Current Outpatient Medications:  .  Elagolix Sodium (ORILISSA) 150 MG TABS, Take 1 tablet by mouth daily., Disp: 30 tablet, Rfl: 11 .  ibuprofen (ADVIL,MOTRIN) 600 MG tablet, Take 1 tablet (600 mg total) by mouth every 6 (six) hours as needed., Disp: 60 tablet, Rfl: 1 .  Multiple Vitamin (MULTIVITAMIN) tablet, Take 1 tablet by mouth daily., Disp: , Rfl:   The following portions of the patient's history were reviewed and updated as appropriate: allergies, current medications, past family history, past medical history, past social history, past surgical history and problem list.   Review of Systems:  Pertinent items noted in HPI and remainder of comprehensive ROS otherwise negative.   Objective:  Physical Exam BP 118/75   Pulse 77   Resp 16   Ht 5\' 2"  (1.575 m)   Wt 174 lb (78.9 kg)   LMP 07/13/2019   BMI 31.83 kg/m  CONSTITUTIONAL: Well-developed, well-nourished female in no acute distress.  HENT:  Normocephalic, atraumatic. External right and left ear  normal. Oropharynx is clear and moist EYES: Conjunctivae and EOM are normal. Pupils are equal, round, and reactive to light. No scleral icterus.  NECK: Normal range of motion, supple, no masses SKIN: Skin is warm and dry. No rash noted. Not diaphoretic. No erythema. No pallor. NEUROLOGIC: Alert and oriented to person, place, and time. Normal reflexes, muscle tone coordination. No cranial nerve deficit noted. PSYCHIATRIC: Normal mood and affect. Normal behavior. Normal judgment and thought content. CARDIOVASCULAR: Normal heart rate noted RESPIRATORY: Effort normal, no problems with respiration noted ABDOMEN: Soft, no distention noted.   PELVIC: Normal appearing external genitalia; normal appearing vaginal mucosa and cervix.  No abnormal discharge noted.  2 cm anterior vaginal wall cyst noted.  pelvic cultures obtained. Normal uterine size, no other palpable masses, mild CMT and uterine tenderness, significant left adnexal tenderness MUSCULOSKELETAL: Normal range of motion. No edema noted.  Exam done with chaperone present.  Labs and Imaging No results found. UPT: neg  Assessment & Plan:  1. Pelvic pain - Cont Orilissa - pain meds prn - suspect just breakthrough pain/bleeding and will self resolve but will obtain TVUS for left sided tenderness to rule out cyst/ovarian etiology - TVUS based on significant left sided tenderness - POCT urine pregnancy   Routine preventative health maintenance measures emphasized. Please refer to After Visit Summary for other counseling recommendations.   Return if symptoms worsen or fail to improve.  Total face-to-face time with patient:  18 minutes. Over 50% of encounter was spent on counseling and coordination of care.  Baldemar Lenis, M.D. Attending Center for [employer] [facility])

## 2019-07-21 ENCOUNTER — Ambulatory Visit: Payer: Managed Care, Other (non HMO) | Admitting: Obstetrics and Gynecology

## 2019-07-21 LAB — CERVICOVAGINAL ANCILLARY ONLY
Bacterial Vaginitis (gardnerella): NEGATIVE
Candida Glabrata: NEGATIVE
Candida Vaginitis: NEGATIVE
Chlamydia: NEGATIVE
Comment: NEGATIVE
Comment: NEGATIVE
Comment: NEGATIVE
Comment: NEGATIVE
Comment: NEGATIVE
Comment: NORMAL
Neisseria Gonorrhea: NEGATIVE
Trichomonas: NEGATIVE

## 2019-07-24 ENCOUNTER — Other Ambulatory Visit: Payer: Self-pay

## 2019-07-24 ENCOUNTER — Ambulatory Visit (INDEPENDENT_AMBULATORY_CARE_PROVIDER_SITE_OTHER): Payer: No Typology Code available for payment source

## 2019-07-24 DIAGNOSIS — R102 Pelvic and perineal pain: Secondary | ICD-10-CM | POA: Diagnosis not present

## 2019-07-26 ENCOUNTER — Encounter: Payer: Self-pay | Admitting: *Deleted

## 2019-08-17 ENCOUNTER — Encounter: Payer: Self-pay | Admitting: Obstetrics and Gynecology

## 2019-08-17 ENCOUNTER — Telehealth (INDEPENDENT_AMBULATORY_CARE_PROVIDER_SITE_OTHER): Payer: No Typology Code available for payment source | Admitting: Obstetrics and Gynecology

## 2019-08-17 DIAGNOSIS — N841 Polyp of cervix uteri: Secondary | ICD-10-CM

## 2019-08-17 DIAGNOSIS — N809 Endometriosis, unspecified: Secondary | ICD-10-CM | POA: Diagnosis not present

## 2019-08-17 DIAGNOSIS — N939 Abnormal uterine and vaginal bleeding, unspecified: Secondary | ICD-10-CM

## 2019-08-17 DIAGNOSIS — R102 Pelvic and perineal pain: Secondary | ICD-10-CM | POA: Diagnosis not present

## 2019-08-17 NOTE — Progress Notes (Signed)
GYNECOLOGY VIRTUAL VISIT ENCOUNTER NOTE  Provider location: Center for [facility] [place]   I connected with April Bolton on 08/22/19 at  2:05 PM EDT by MyChart Video Encounter at home and verified that I am speaking with the correct person using two identifiers.   I discussed the limitations, risks, security and privacy concerns of performing an evaluation and management service virtually and the availability of in person appointments. I also discussed with the patient that there may be a patient responsible charge related to this service. The patient expressed understanding and agreed to proceed.   History:  April Bolton is a 34 y.o. [id number] female being evaluated today for pelvic pain and bleeding while on Orilissa. Still having some bleeding but not nearly as heavy as before she started [place]. Pain has improved as well. Denies other complaints.      Past Medical History:  Diagnosis Date   Endometriosis    Ovarian cyst    Pregnancy induced hypertension 2011   Preterm labor    Past Surgical History:  Procedure Laterality Date   APPENDECTOMY  2005   brachial cleft cyst  2006   HERNIA REPAIR  2005   inguinal   LAPAROSCOPY  2008   WISDOM TOOTH EXTRACTION  2006   The following portions of the patient's history were reviewed and updated as appropriate: allergies, current medications, past family history, past medical history, past social history, past surgical history and problem list.    Review of Systems:  Pertinent items noted in HPI and remainder of comprehensive ROS otherwise negative.  Physical Exam:   General:  Alert, oriented and cooperative. Patient appears to be in no acute distress.  Mental Status: Normal mood and affect. Normal behavior. Normal judgment and thought content.   Respiratory: Normal respiratory effort, no problems with respiration noted  Rest of physical exam deferred due to type of encounter  Labs and  Imaging No results found for this or any previous visit (from the past 336 hour(s)). US PELVIC COMPLETE WITH TRANSVAGINAL  Result Date: 07/24/2019 CLINICAL DATA:  Pelvic pain for 1 week, LMP 07/13/2019, G2P2, history of ovarian cysts and endometriosis EXAM: TRANSABDOMINAL AND TRANSVAGINAL ULTRASOUND OF PELVIS TECHNIQUE: Both transabdominal and transvaginal ultrasound examinations of the pelvis were performed. Transabdominal technique was performed for global imaging of the pelvis including uterus, ovaries, adnexal regions, and pelvic cul-de-sac. It was necessary to proceed with endovaginal exam following the transabdominal exam to visualize the adnexa. COMPARISON:  12/16/2017 FINDINGS: Uterus Measurements: 9.0 x 4.7 x 5.2 cm = volume: 115 mL. Mildly heterogeneous myometrium. No focal uterine mass. Incidentally noted nabothian cysts at cervix. Endometrium Thickness: 17 mm. Mildly heterogeneous. No endometrial fluid. Small amount of fluid within endocervical canal. Small endocervical polyp 6 x 2 x 3 mm. Right ovary Measurements: 4.2 x 2.5 x 2.5 cm = volume: 14 mL. Numerous follicles without mass Left ovary Measurements: 2.7 x 2.3 x 2.2 cm = volume: 7 mL. Numerous follicles without mass Other findings No free pelvic fluid.  No adnexal masses. IMPRESSION: Upper normal thickness of endometrial complex without focal mass. Small endocervical polyp 6 x 2 x 3 mm. Uterus and endometrium otherwise unremarkable. Numerous follicles in both ovaries, nonspecific but could reflect polycystic ovaries, recommend clinical and laboratory correlation. Electronically Signed   By: Ulyses Southward M.D.   On: 07/24/2019 14:03       Assessment and Plan:   1. Cervical polyp - Reviewed cervical polyps likely benign, however may be source  of bleeding - if has another episode of heavy bleeding, recommend D&C hysteroscopy with plan for polyp removal  2. Pelvic pain - self-resolved, likely breakthrough on orilissa, as she has continued to  do well, will not change dose at this time  3. Abnormal uterine bleeding (AUB) See above - may be related to [place] use, unrelated to polyp and self-resolving  4. Endometriosis Cont orilissa for now    I discussed the assessment and treatment plan with the patient. The patient was provided an opportunity to ask questions and all were answered. The patient agreed with the plan and demonstrated an understanding of the instructions.   The patient was advised to call back or seek an in-person evaluation/go to the ED if the symptoms worsen or if the condition fails to improve as anticipated.  I provided 12 minutes of face-to-face time during this encounter.   Conan Bowens, MD Center for [facility], [facility]

## 2019-10-16 ENCOUNTER — Other Ambulatory Visit: Payer: Self-pay | Admitting: Obstetrics and Gynecology

## 2019-10-16 ENCOUNTER — Other Ambulatory Visit: Payer: Self-pay

## 2019-10-16 ENCOUNTER — Encounter: Payer: Self-pay | Admitting: Obstetrics and Gynecology

## 2019-10-16 ENCOUNTER — Ambulatory Visit: Payer: No Typology Code available for payment source | Admitting: Obstetrics and Gynecology

## 2019-10-16 VITALS — BP 116/76 | HR 69 | Resp 16 | Ht 62.0 in | Wt 174.0 lb

## 2019-10-16 DIAGNOSIS — N840 Polyp of corpus uteri: Secondary | ICD-10-CM

## 2019-10-16 NOTE — Progress Notes (Signed)
   GYNECOLOGY OFFICE FOLLOW UP NOTE  History:  34 y.o. [id number] here today for follow up for bleeding and pain. Also here for results of [place]. Still having irregular bleeding, some cramping but not nearly as her pain in June.  Past Medical History:  Diagnosis Date  . Endometriosis   . Ovarian cyst   . Pregnancy induced hypertension 2011  . Preterm labor     Past Surgical History:  Procedure Laterality Date  . APPENDECTOMY  2005  . brachial cleft cyst  2006  . HERNIA REPAIR  2005   inguinal  . LAPAROSCOPY  2008  . WISDOM TOOTH EXTRACTION  2006     Current Outpatient Medications:  .  Elagolix Sodium (ORILISSA) 150 MG TABS, Take 1 tablet by mouth daily., Disp: 30 tablet, Rfl: 11 .  ibuprofen (ADVIL,MOTRIN) 600 MG tablet, Take 1 tablet (600 mg total) by mouth every 6 (six) hours as needed., Disp: 60 tablet, Rfl: 1 .  Multiple Vitamin (MULTIVITAMIN) tablet, Take 1 tablet by mouth daily., Disp: , Rfl:   The following portions of the patient's history were reviewed and updated as appropriate: allergies, current medications, past family history, past medical history, past social history, past surgical history and problem list.   Review of Systems:  Pertinent items noted in HPI and remainder of comprehensive ROS otherwise negative.   Objective:  Physical Exam BP 116/76   Pulse 69   Resp 16   Ht 5\' 2"  (1.575 m)   Wt 174 lb (78.9 kg)   LMP 07/16/2019   BMI 31.83 kg/m  CONSTITUTIONAL: Well-developed, well-nourished female in no acute distress.  HENT:  Normocephalic, atraumatic. External right and left ear normal. Oropharynx is clear and moist EYES: Conjunctivae and EOM are normal. Pupils are equal, round, and reactive to light. No scleral icterus.  NECK: Normal range of motion, supple, no masses SKIN: Skin is warm and dry. No rash noted. Not diaphoretic. No erythema. No pallor. NEUROLOGIC: Alert and oriented to person, place, and time. Normal reflexes, muscle tone coordination. No  cranial nerve deficit noted. PSYCHIATRIC: Normal mood and affect. Normal behavior. Normal judgment and thought content. CARDIOVASCULAR: Normal heart rate noted RESPIRATORY: Effort normal, no problems with respiration noted ABDOMEN: Soft, no distention noted.   PELVIC: Normal appearing external genitalia; normal appearing vaginal mucosa and cervix.  No abnormal discharge noted.  EMB obtained, see note for details. MUSCULOSKELETAL: Normal range of motion. No edema noted.  Exam done with chaperone present.  Labs and Imaging No results found.  Assessment & Plan:  1. Uterine polyp - reviewed cervical polyp noted on 07/18/2019 - Reviewed that polyp may or may not be cause of irregular bleeding and cramping, cannot guarantee that it will improve if polyp is removed - reviewed that removal may improve the symptoms if they are due to polyp but may not - recommend play for D&C hysteroscopy and polyp removal in OR, reviewed risks/benefits, she is agreeable to proceed - EMB today - POCT urine pregnancy - Surgical pathology( [facility]/ [facility])   Routine preventative health maintenance measures emphasized. Please refer to After Visit Summary for other counseling recommendations.   No follow-ups on file.  Total face-to-face time with patient: 25 minutes. Over 50% of encounter was spent on counseling and coordination of care.  [place], M.D. Attending Center for [facility] Len[facility] [employer])

## 2019-10-16 NOTE — Progress Notes (Signed)
ENDOMETRIAL BIOPSY      April Bolton is a 34 y.o. [id number] here for endometrial biopsy.  The indications for endometrial biopsy were reviewed.  Risks of the biopsy including cramping, bleeding, infection, uterine perforation, inadequate specimen and need for additional procedures were discussed. The patient states she understands and agrees to undergo procedure today. Consent was signed. Time out was performed.   Indications: AUB, cervical polyp noted on [place] Urine HCG: negative  A bivalve speculum was placed into the vagina and the cervix was easily visualized and was prepped with Betadine x2. A single-toothed tenaculum was placed on the anterior lip of the cervix to stabilize it. The 3 mm pipelle was introduced into the endometrial cavity without difficulty to a depth of 9 cm, and a moderate amount of tissue was obtained and sent to pathology. This was repeated for a total of 3 passes. The instruments were removed from the patient's vagina. Minimal bleeding from the cervix at the tenaculum was noted.   The patient tolerated the procedure well. Routine post-procedure instructions were given to the patient.    Will base further management on results of biopsy.  Baldemar Lenis, M.D. Attending Center for [employer] [facility])

## 2019-10-17 LAB — POCT URINE PREGNANCY: Preg Test, Ur: NEGATIVE

## 2019-11-08 ENCOUNTER — Encounter (HOSPITAL_BASED_OUTPATIENT_CLINIC_OR_DEPARTMENT_OTHER): Payer: Self-pay | Admitting: Obstetrics and Gynecology

## 2019-11-08 ENCOUNTER — Other Ambulatory Visit: Payer: Self-pay

## 2019-11-11 ENCOUNTER — Other Ambulatory Visit (HOSPITAL_COMMUNITY): Payer: No Typology Code available for payment source

## 2019-11-13 ENCOUNTER — Other Ambulatory Visit (HOSPITAL_COMMUNITY)
Admission: RE | Admit: 2019-11-13 | Discharge: 2019-11-13 | Disposition: A | Discharge status: Home / Self Care | Payer: No Typology Code available for payment source | Source: Ambulatory Visit | Attending: Obstetrics and Gynecology | Admitting: Obstetrics and Gynecology

## 2019-11-13 DIAGNOSIS — Z01812 Encounter for preprocedural laboratory examination: Secondary | ICD-10-CM | POA: Diagnosis present

## 2019-11-13 DIAGNOSIS — Z20822 Contact with and (suspected) exposure to covid-19: Secondary | ICD-10-CM | POA: Insufficient documentation

## 2019-11-13 LAB — SARS CORONAVIRUS 2 (TAT 6-24 HRS): SARS Coronavirus 2: NEGATIVE

## 2019-11-14 NOTE — Progress Notes (Signed)
Noted orders from Dr Earlene Plater for CBC and BMET.  Left message for patient to call about coming today or earlier 9/29

## 2019-11-15 ENCOUNTER — Ambulatory Visit (HOSPITAL_BASED_OUTPATIENT_CLINIC_OR_DEPARTMENT_OTHER)
Admission: RE | Admit: 2019-11-15 | Discharge: 2019-11-15 | Disposition: A | Discharge status: Home / Self Care | Payer: No Typology Code available for payment source | Attending: Obstetrics and Gynecology | Admitting: Obstetrics and Gynecology

## 2019-11-15 ENCOUNTER — Ambulatory Visit (HOSPITAL_BASED_OUTPATIENT_CLINIC_OR_DEPARTMENT_OTHER): Payer: No Typology Code available for payment source | Admitting: Anesthesiology

## 2019-11-15 ENCOUNTER — Encounter (HOSPITAL_BASED_OUTPATIENT_CLINIC_OR_DEPARTMENT_OTHER): Payer: Self-pay | Admitting: Obstetrics and Gynecology

## 2019-11-15 ENCOUNTER — Other Ambulatory Visit: Payer: Self-pay

## 2019-11-15 ENCOUNTER — Encounter (HOSPITAL_BASED_OUTPATIENT_CLINIC_OR_DEPARTMENT_OTHER)
Admission: RE | Disposition: A | Discharge status: Home / Self Care | Payer: Self-pay | Source: Home / Self Care | Attending: Obstetrics and Gynecology

## 2019-11-15 DIAGNOSIS — Z87891 Personal history of nicotine dependence: Secondary | ICD-10-CM | POA: Diagnosis not present

## 2019-11-15 DIAGNOSIS — Z888 Allergy status to other drugs, medicaments and biological substances status: Secondary | ICD-10-CM | POA: Insufficient documentation

## 2019-11-15 DIAGNOSIS — N841 Polyp of cervix uteri: Secondary | ICD-10-CM

## 2019-11-15 DIAGNOSIS — N939 Abnormal uterine and vaginal bleeding, unspecified: Secondary | ICD-10-CM

## 2019-11-15 DIAGNOSIS — Z882 Allergy status to sulfonamides status: Secondary | ICD-10-CM | POA: Diagnosis not present

## 2019-11-15 DIAGNOSIS — Z79899 Other long term (current) drug therapy: Secondary | ICD-10-CM | POA: Diagnosis not present

## 2019-11-15 HISTORY — PX: DILATATION & CURETTAGE/HYSTEROSCOPY WITH MYOSURE: SHX6511

## 2019-11-15 HISTORY — DX: Other complications of anesthesia, initial encounter: T88.59XA

## 2019-11-15 LAB — BASIC METABOLIC PANEL
Anion gap: 7 (ref 5–15)
BUN: 12 mg/dL (ref 6–20)
CO2: 25 mmol/L (ref 22–32)
Calcium: 9.3 mg/dL (ref 8.9–10.3)
Chloride: 105 mmol/L (ref 98–111)
Creatinine, Ser: 0.74 mg/dL (ref 0.44–1.00)
GFR calc Af Amer: >60 mL/min (ref >60)
GFR calc non Af Amer: >60 mL/min (ref >60)
Glucose, Bld: 93 mg/dL (ref 70–99)
Potassium: 3.8 mmol/L (ref 3.5–5.1)
Sodium: 137 mmol/L (ref 135–145)

## 2019-11-15 LAB — CBC
HCT: 43.6 % (ref 36.0–46.0)
Hemoglobin: 14 g/dL (ref 12.0–15.0)
MCH: 29 pg (ref 26.0–34.0)
MCHC: 32.1 g/dL (ref 30.0–36.0)
MCV: 90.5 fL (ref 80.0–100.0)
Platelets: 257 10*3/uL (ref 150–400)
RBC: 4.82 MIL/uL (ref 3.87–5.11)
RDW: 12.3 % (ref 11.5–15.5)
WBC: 6.4 10*3/uL (ref 4.0–10.5)
nRBC: 0 % (ref 0.0–0.2)

## 2019-11-15 LAB — POCT PREGNANCY, URINE: Preg Test, Ur: NEGATIVE

## 2019-11-15 SURGERY — DILATATION & CURETTAGE/HYSTEROSCOPY WITH MYOSURE
Anesthesia: General | Site: Uterus

## 2019-11-15 MED ORDER — FENTANYL CITRATE (PF) 100 MCG/2ML IJ SOLN
25.0000 ug | INTRAMUSCULAR | Status: DC | PRN
  Administered 2019-11-15 (×2): 25 ug via INTRAVENOUS

## 2019-11-15 MED ORDER — LIDOCAINE HCL (CARDIAC) PF 100 MG/5ML IV SOSY
PREFILLED_SYRINGE | INTRAVENOUS | Status: DC | PRN
  Administered 2019-11-15: 50 mg via INTRATRACHEAL

## 2019-11-15 MED ORDER — MEPERIDINE HCL 25 MG/ML IJ SOLN: 6.2500 mg | INTRAMUSCULAR | Status: DC | PRN

## 2019-11-15 MED ORDER — MIDAZOLAM HCL 2 MG/2ML IJ SOLN
INTRAMUSCULAR | Status: AC
  Filled 2019-11-15: qty 2

## 2019-11-15 MED ORDER — SODIUM CHLORIDE 0.9 % IR SOLN
Status: DC | PRN
  Administered 2019-11-15: 1

## 2019-11-15 MED ORDER — OXYCODONE HCL 5 MG PO TABS: 5.0000 mg | ORAL_TABLET | ORAL | 0 refills | Status: DC | PRN

## 2019-11-15 MED ORDER — ONDANSETRON HCL 4 MG/2ML IJ SOLN
INTRAMUSCULAR | Status: DC | PRN
  Administered 2019-11-15: 4 mg via INTRAVENOUS

## 2019-11-15 MED ORDER — FENTANYL CITRATE (PF) 100 MCG/2ML IJ SOLN
INTRAMUSCULAR | Status: AC
  Filled 2019-11-15: qty 2

## 2019-11-15 MED ORDER — LACTATED RINGERS IV SOLN: INTRAVENOUS | Status: DC

## 2019-11-15 MED ORDER — PROPOFOL 10 MG/ML IV BOLUS
INTRAVENOUS | Status: DC | PRN
  Administered 2019-11-15: 150 mg via INTRAVENOUS

## 2019-11-15 MED ORDER — SOD CITRATE-CITRIC ACID 500-334 MG/5ML PO SOLN
ORAL | Status: AC
  Filled 2019-11-15: qty 15

## 2019-11-15 MED ORDER — POVIDONE-IODINE 10 % EX SWAB
2.0000 "application " | Freq: Once | CUTANEOUS | Status: AC
  Administered 2019-11-15: 2 via TOPICAL

## 2019-11-15 MED ORDER — OXYCODONE HCL 5 MG/5ML PO SOLN: 5.0000 mg | Freq: Once | ORAL | Status: DC | PRN

## 2019-11-15 MED ORDER — FENTANYL CITRATE (PF) 100 MCG/2ML IJ SOLN
INTRAMUSCULAR | Status: DC | PRN
Start: 2019-11-15 — End: 2019-11-15
  Administered 2019-11-15: 50 ug via INTRAVENOUS

## 2019-11-15 MED ORDER — SOD CITRATE-CITRIC ACID 500-334 MG/5ML PO SOLN
30.0000 mL | ORAL | Status: AC
  Administered 2019-11-15: 30 mL via ORAL

## 2019-11-15 MED ORDER — OXYCODONE HCL 5 MG PO TABS: 5.0000 mg | ORAL_TABLET | Freq: Once | ORAL | Status: DC | PRN

## 2019-11-15 MED ORDER — DEXAMETHASONE SODIUM PHOSPHATE 10 MG/ML IJ SOLN
INTRAMUSCULAR | Status: AC
  Filled 2019-11-15: qty 1

## 2019-11-15 MED ORDER — DEXAMETHASONE SODIUM PHOSPHATE 10 MG/ML IJ SOLN
INTRAMUSCULAR | Status: DC | PRN
  Administered 2019-11-15: 10 mg via INTRAVENOUS

## 2019-11-15 MED ORDER — IBUPROFEN 800 MG PO TABS: 800.0000 mg | ORAL_TABLET | Freq: Three times a day (TID) | ORAL | 1 refills | Status: DC | PRN

## 2019-11-15 MED ORDER — ONDANSETRON HCL 4 MG/2ML IJ SOLN: 4.0000 mg | Freq: Once | INTRAMUSCULAR | Status: DC | PRN

## 2019-11-15 MED ORDER — PROPOFOL 500 MG/50ML IV EMUL
INTRAVENOUS | Status: DC | PRN
  Administered 2019-11-15: 25 ug/kg/min via INTRAVENOUS

## 2019-11-15 MED ORDER — ACETAMINOPHEN 160 MG/5ML PO SOLN: 325.0000 mg | ORAL | Status: DC | PRN

## 2019-11-15 MED ORDER — SILVER NITRATE-POT NITRATE 75-25 % EX MISC
CUTANEOUS | Status: DC | PRN
  Administered 2019-11-15: 1

## 2019-11-15 MED ORDER — MIDAZOLAM HCL 5 MG/5ML IJ SOLN
INTRAMUSCULAR | Status: DC | PRN
  Administered 2019-11-15: 2 mg via INTRAVENOUS

## 2019-11-15 MED ORDER — PROPOFOL 10 MG/ML IV BOLUS
INTRAVENOUS | Status: AC
  Filled 2019-11-15: qty 20

## 2019-11-15 MED ORDER — ACETAMINOPHEN 325 MG PO TABS: 325.0000 mg | ORAL_TABLET | ORAL | Status: DC | PRN

## 2019-11-15 SURGICAL SUPPLY — 17 items
CATH ROBINSON RED A/P 16FR (CATHETERS) ×2 IMPLANT
DEVICE MYOSURE LITE (MISCELLANEOUS) IMPLANT
DEVICE MYOSURE REACH (MISCELLANEOUS) ×2 IMPLANT
GAUZE 4X4 16PLY RFD (DISPOSABLE) ×2 IMPLANT
GLOVE BIO SURGEON STRL SZ 6.5 (GLOVE) ×2 IMPLANT
GLOVE BIOGEL PI IND STRL 6.5 (GLOVE) ×1 IMPLANT
GLOVE BIOGEL PI IND STRL 7.0 (GLOVE) ×1 IMPLANT
GLOVE BIOGEL PI INDICATOR 6.5 (GLOVE) ×1
GLOVE BIOGEL PI INDICATOR 7.0 (GLOVE) ×1
GOWN STRL REUS W/TWL LRG LVL3 (GOWN DISPOSABLE) ×4 IMPLANT
KIT PROCEDURE FLUENT (KITS) ×2 IMPLANT
PACK VAGINAL MINOR WOMEN LF (CUSTOM PROCEDURE TRAY) ×2 IMPLANT
PAD OB MATERNITY 4.3X12.25 (PERSONAL CARE ITEMS) ×2 IMPLANT
PAD PREP 24X48 CUFFED NSTRL (MISCELLANEOUS) ×2 IMPLANT
SEAL ROD LENS SCOPE MYOSURE (ABLATOR) ×2 IMPLANT
SLEEVE SCD COMPRESS KNEE MED (MISCELLANEOUS) ×2 IMPLANT
TOWEL GREEN STERILE FF (TOWEL DISPOSABLE) ×4 IMPLANT

## 2019-11-15 NOTE — H&P (Signed)
OB/GYN Pre-Op History and Physical  April Bolton is a 34 y.o. [id number] presenting for hysteroscopy dilation and curettage with polypectomy. Has had abnormal bleeding while on Orilissa and endometrial biopsy showed benign cervical/endometrial polyp. [place] with cervical polyp noted.      Past Medical History:  Diagnosis Date  . Complication of anesthesia    slow to wake up  . Endometriosis   . Ovarian cyst   . Pregnancy induced hypertension 2011  . Preterm labor     Past Surgical History:  Procedure Laterality Date  . APPENDECTOMY  2005  . brachial cleft cyst  2006  . HERNIA REPAIR  2005   inguinal  . LAPAROSCOPY  2008  . WISDOM TOOTH EXTRACTION  2006    OB History  Gravida Para Term Preterm AB Living  2 2 2     2   SAB TAB Ectopic Multiple Live Births        0 2    # Outcome Date GA Lbr Len/2nd Weight Sex Delivery Anes PTL Lv  2 Term 02/09/14 [redacted]w[redacted]d 01:33 / 00:17 3305 g F Vag-Spont EPI  LIV     Birth Comments: within normal limits  1 Term 11/07/09 [redacted]w[redacted]d  3544 g M Vag-Vacuum   LIV     Birth Comments: h/o ptl; GHTN, IOL 39 wks; 2nd degree    Social History   Socioeconomic History  . Marital status: Married    Spouse name: Not on file  . Number of children: Not on file  . Years of education: Not on file  . Highest education level: Not on file  Occupational History  . Occupation: [redacted]w[redacted]d  Tobacco Use  . Smoking status: Former Smoker    Packs/day: 0.30    Years: 4.00    Pack years: 1.20    Types: Cigarettes    Quit date: 12/26/2008    Years since quitting: 10.8  . Smokeless tobacco: Never Used  Vaping Use  . Vaping Use: Never used  Substance and Sexual Activity  . Alcohol use: No    Comment: Not since Pregnancy  . Drug use: No  . Sexual activity: Yes    Partners: Male    Birth control/protection: Pill  Other Topics Concern  . Not on file  Social History Narrative  . Not on file   Social Determinants of Health   Financial Resource Strain:   .  Difficulty of Paying Living Expenses: Not on file  Food Insecurity:   . Worried About 13/11/2008 in the Last Year: Not on file  . Ran Out of Food in the Last Year: Not on file  Transportation Needs:   . Lack of Transportation (Medical): Not on file  . Lack of Transportation (Non-Medical): Not on file  Physical Activity:   . Days of Exercise per Week: Not on file  . Minutes of Exercise per Session: Not on file  Stress:   . Feeling of Stress : Not on file  Social Connections:   . Frequency of Communication with Friends and Family: Not on file  . Frequency of Social Gatherings with Friends and Family: Not on file  . Attends Religious Services: Not on file  . Active Member of Clubs or Organizations: Not on file  . Attends Programme researcher, broadcasting/film/video Meetings: Not on file  . Marital Status: Not on file    Family History  Problem Relation Age of Onset  . Cancer Mother        cervical  .  Diabetes Father   . Heart disease Father   . Hypertension Father     Medications Prior to Admission  Medication Sig Dispense Refill Last Dose  . Elagolix Sodium (ORILISSA) 150 MG TABS Take 1 tablet by mouth daily. 30 tablet 11 11/14/2019 at Unknown time  . ibuprofen (ADVIL,MOTRIN) 600 MG tablet Take 1 tablet (600 mg total) by mouth every 6 (six) hours as needed. 60 tablet 1 Unknown at Unknown time  . Multiple Vitamin (MULTIVITAMIN) tablet Take 1 tablet by mouth daily.   Unknown at Unknown time    Allergies  Allergen Reactions  . Brethine [Terbutaline] Other (See Comments)    Syncope and BP bottoms out  . Sulfa Antibiotics Rash    Review of Systems: Negative except for what is mentioned in HPI.     Physical Exam: BP 125/73   Pulse 80   Temp 98.5 F (36.9 C) (Oral)   Resp 16   Ht 5\' 2"  (1.575 m)   Wt 78 kg   SpO2 100%   BMI 31.45 kg/m  CONSTITUTIONAL: Well-developed, well-nourished female in no acute distress.  HENT:  Normocephalic, atraumatic, External right and left ear normal.  Oropharynx is clear and moist EYES: Conjunctivae and EOM are normal. Pupils are equal, round, and reactive to light. No scleral icterus.  NECK: Normal range of motion, supple, no masses SKIN: Skin is warm and dry. No rash noted. Not diaphoretic. No erythema. No pallor. NEUROLGIC: Alert and oriented to person, place, and time. Normal reflexes, muscle tone coordination. No cranial nerve deficit noted. PSYCHIATRIC: Normal mood and affect. Normal behavior. Normal judgment and thought content. CARDIOVASCULAR: Normal heart rate noted RESPIRATORY: Effort normal, no problems with respiration noted ABDOMEN: Soft, nontender, nondistended PELVIC: Deferred MUSCULOSKELETAL: Normal range of motion. No edema and no tenderness. 2+ distal pulses.   Pertinent Labs/Studies:   Results for orders placed or performed during the hospital encounter of 11/15/19 (from the past 72 hour(s))  Pregnancy, urine POC     Status: None   Collection Time: 11/15/19 12:28 PM  Result Value Ref Range   Preg Test, Ur NEGATIVE NEGATIVE    Comment:        THE SENSITIVITY OF THIS METHODOLOGY IS >24 mIU/mL   Basic metabolic panel     Status: None   Collection Time: 11/15/19 12:58 PM  Result Value Ref Range   Sodium 137 135 - 145 mmol/L   Potassium 3.8 3.5 - 5.1 mmol/L   Chloride 105 98 - 111 mmol/L   CO2 25 22 - 32 mmol/L   Glucose, Bld 93 70 - 99 mg/dL    Comment: Glucose reference range applies only to samples taken after fasting for at least 8 hours.   BUN 12 6 - 20 mg/dL   Creatinine, Ser 11/17/19 0.44 - 1.00 mg/dL   Calcium 9.3 8.9 - 9.51 mg/dL   GFR calc non Af Amer >60 >60 mL/min   GFR calc Af Amer >60 >60 mL/min   Anion gap 7 5 - 15    Comment: Performed at [facility], [address], [place], [place]  CBC     Status: None   Collection Time: 11/15/19 12:58 PM  Result Value Ref Range   WBC 6.4 4.0 - 10.5 K/uL   RBC 4.82 3.87 - 5.11 MIL/uL   Hemoglobin 14.0 12.0 - 15.0 g/dL   HCT 11/17/19 36 - 46 %     MCV 90.5 80.0 - 100.0 fL   MCH 29.0  26.0 - 34.0 pg   MCHC 32.1 30.0 - 36.0 g/dL   RDW 62.3 76.2 - 83.1 %   Platelets 257 150 - 400 K/uL   nRBC 0.0 0.0 - 0.2 %    Comment: Performed at [facility], [address] [place], [place] [address]       Assessment and Plan :Shawonda Kerce is a 34 y.o. [id number] here for hysteroscopy, dilation and curettage with polypectomy for bleeding on Orilissa. She has had ongoing bleeding with bleeding for a few days every few weeks. Polyp noted on [place] and endometrial biopsy, recommended for polypectomy.   The risks of hysteroscopic polypectomy with D&C were reviewed with the patient; including but not limited to: infection which may require antibiotics; bleeding which may require transfusion or re-operation; injury to bowel, bladder, ureters or other surrounding organs; need for additional procedures including hysterectomy in the event of a life-threatening hemorrhage; thromboembolic phenomenon and other postoperative/anesthesia complications. Reviewed that all sampling will be sent to pathology and further management based on findings. The patient concurred with the proposed plan, giving informed consent for the procedure. She is agreeable to a blood transfusion in the event of emergency.  Patient is NPO Anesthesia aware SCDs  Admission labs To OR when ready    K. Therese Sarah, M.D. Attending Obstetrician & Gynecologist, [facility] for [employer], [facility]

## 2019-11-15 NOTE — Discharge Instructions (Signed)
Dilation and Curettage, Care After These instructions give you information about caring for yourself after your procedure. Your doctor may also give you more specific instructions. Call your doctor if you have any problems or questions after your procedure. Follow these instructions at home: Activity  Do not drive or use heavy machinery while taking prescription pain medicine.  For 24 hours after your procedure, avoid driving.  Take short walks often, followed by rest periods. Ask your doctor what activities are safe for you. After one or two days, you may be able to return to your normal activities.  Do not lift anything that is heavier than 10 lb (4.5 kg) until your doctor approves.  For at least 2 weeks, or as long as told by your doctor: ? Do not douche. ? Do not use tampons.  ? Do not have sex. General instructions   Take over-the-counter and prescription medicines only as told by your doctor. This is very important if you take blood thinning medicine.  Do not take baths, swim, or use a hot tub until your doctor approves. Take showers instead of baths.  Wear compression stockings as told by your doctor.  It is up to you to get the results of your procedure. Ask your doctor when your results will be ready.  Keep all follow-up visits as told by your doctor. This is important. Contact a doctor if:  You have very bad cramps that get worse or do not get better with medicine.  You have very bad pain in your belly (abdomen).  You cannot drink fluids without throwing up (vomiting).  You get pain in a different part of the area between your belly and thighs (pelvis).  You have bad-smelling discharge from your vagina.  You have a rash. Get help right away if:  You are bleeding a lot from your vagina. A lot of bleeding means soaking more than one sanitary pad in an hour, for 2 hours in a row.  You have clumps of blood (blood clots) coming from your vagina.  You have a fever  or chills.  Your belly feels very tender or hard.  You have chest pain.  You have trouble breathing.  You cough up blood.  You feel dizzy.  You feel light-headed.  You pass out (faint).  You have pain in your neck or shoulder area. Summary  Take short walks often, followed by rest periods. Ask your doctor what activities are safe for you. After one or two days, you may be able to return to your normal activities.  Do not lift anything that is heavier than 10 lb (4.5 kg) until your doctor approves.  Do not take baths, swim, or use a hot tub until your doctor approves. Take showers instead of baths.  Contact your doctor if you have any symptoms of infection, like bad-smelling discharge from your vagina. This information is not intended to replace advice given to you by your health care provider. Make sure you discuss any questions you have with your health care provider. Document Revised: 01/15/2017 Document Reviewed: 10/21/2015 Elsevier Patient Education  2020 [employer]     Post Anesthesia Home Care Instructions  Activity: Get plenty of rest for the remainder of the day. A responsible individual must stay with you for 24 hours following the procedure.  For the next 24 hours, DO NOT: -Drive a car -Operate machinery -Drink alcoholic beverages -Take any medication unless instructed by your physician -Make any legal decisions or sign important papers.    Meals: Start with liquid foods such as gelatin or soup. Progress to regular foods as tolerated. Avoid greasy, spicy, heavy foods. If nausea and/or vomiting occur, drink only clear liquids until the nausea and/or vomiting subsides. Call your physician if vomiting continues.  Special Instructions/Symptoms: Your throat may feel dry or sore from the anesthesia or the breathing tube placed in your throat during surgery. If this causes discomfort, gargle with warm salt water. The discomfort should disappear within 24  hours.  If you had a scopolamine patch placed behind your ear for the management of post- operative nausea and/or vomiting:  1. The medication in the patch is effective for 72 hours, after which it should be removed.  Wrap patch in a tissue and discard in the trash. Wash hands thoroughly with soap and water. 2. You may remove the patch earlier than 72 hours if you experience unpleasant side effects which may include dry mouth, dizziness or visual disturbances. 3. Avoid touching the patch. Wash your hands with soap and water after contact with the patch.     

## 2019-11-15 NOTE — Anesthesia Procedure Notes (Signed)
Procedure Name: LMA Insertion Date/Time: 11/15/2019 3:24 PM Performed by: Thornell Mule, CRNA Pre-anesthesia Checklist: Patient identified, Emergency Drugs available, Suction available and Patient being monitored Patient Re-evaluated:Patient Re-evaluated prior to induction Oxygen Delivery Method: Circle system utilized Preoxygenation: Pre-oxygenation with 100% oxygen Induction Type: IV induction LMA: LMA inserted LMA Size: 4.0 Number of attempts: 1 Placement Confirmation: positive ETCO2 Tube secured with: Tape Dental Injury: Teeth and Oropharynx as per pre-operative assessment

## 2019-11-15 NOTE — Brief Op Note (Signed)
11/15/2019  4:03 PM  PATIENT:  April Bolton  34 y.o. female  PRE-OPERATIVE DIAGNOSIS:  Uterine/Cervical Polyp  POST-OPERATIVE DIAGNOSIS:  Uterine/Cervical Polyp  PROCEDURE:  Procedure(s): DILATATION & CURETTAGE/HYSTEROSCOPY WITH MYOSURE (N/A)  SURGEON:  Surgeon(s) and Role:    * Conan Bowens, MD - Primary  PHYSICIAN ASSISTANT:   ASSISTANTS: none   ANESTHESIA:   general  EBL:  20 mL   BLOOD ADMINISTERED:none  DRAINS: none   LOCAL MEDICATIONS USED:  NONE  SPECIMEN:  Source of Specimen:  endometrial and endocervical curettings  DISPOSITION OF SPECIMEN:  PATHOLOGY  COUNTS:  YES  TOURNIQUET:  * No tourniquets in log *  DICTATION: .Note written in EPIC  PLAN OF CARE: Discharge to home after PACU  PATIENT DISPOSITION:  PACU - hemodynamically stable.   Delay start of Pharmacological VTE agent (>24hrs) due to surgical blood loss or risk of bleeding: no

## 2019-11-15 NOTE — Anesthesia Preprocedure Evaluation (Addendum)
Anesthesia Evaluation  Patient identified by MRN, date of birth, ID band Patient awake    Reviewed: Allergy & Precautions, H&P , NPO status , Patient's Chart, lab work & pertinent test results  History of Anesthesia Complications (+) PROLONGED EMERGENCE, Family history of anesthesia reaction and history of anesthetic complications (father has history of muscular dystrophy, she has never had any issues with anesthesia)  Airway Mallampati: II  TM Distance: >3 FB Neck ROM: Full    Dental no notable dental hx. (+) Dental Advisory Given   Pulmonary former smoker,    Pulmonary exam normal breath sounds clear to auscultation       Cardiovascular hypertension, Normal cardiovascular exam Rhythm:Regular Rate:Normal     Neuro/Psych negative neurological ROS  negative psych ROS   GI/Hepatic negative GI ROS, Neg liver ROS,   Endo/Other  negative endocrine ROS  Renal/GU negative Renal ROS  negative genitourinary   Musculoskeletal negative musculoskeletal ROS (+)   Abdominal   Peds negative pediatric ROS (+)  Hematology negative hematology ROS (+)   Anesthesia Other Findings   Reproductive/Obstetrics (+) Pregnancy                             Anesthesia Physical  Anesthesia Plan  ASA: II  Anesthesia Plan: General   Post-op Pain Management:    Induction: Intravenous  PONV Risk Score and Plan: 3 and Ondansetron, Dexamethasone and Treatment may vary due to age or medical condition  Airway Management Planned: LMA and Oral ETT  Additional Equipment: None  Intra-op Plan:   Post-operative Plan:   Informed Consent: I have reviewed the patients History and Physical, chart, labs and discussed the procedure including the risks, benefits and alternatives for the proposed anesthesia with the patient or authorized representative who has indicated his/her understanding and acceptance.     Dental  advisory given  Plan Discussed with: CRNA, Surgeon and Anesthesiologist  Anesthesia Plan Comments: ( )        Anesthesia Quick Evaluation

## 2019-11-15 NOTE — Op Note (Signed)
April Bolton PROCEDURE DATE: 11/15/2019  PREOPERATIVE DIAGNOSIS:  abnormal uterine bleeding, suspected polyp  POSTOPERATIVE DIAGNOSIS: same  PROCEDURE: Operative Hysteroscopy, dilation and curettage  SURGEON:  Dr. Baldemar Lenis  ASSISTANT:  none  ANESTHESIOLOGY TEAM: Anesthesiologist: Bethena Midget, MD CRNA: Thornell Mule, CRNA  INDICATIONS: 34 y.o. [phone]  here for scheduled surgery for the aforementioned diagnoses.   Risks of surgery were discussed with the patient including but not limited to: bleeding which may require transfusion; infection which may require antibiotics; injury to uterus or surrounding organs; intrauterine scarring which may impair future fertility; need for additional procedures including laparotomy or laparoscopy; and other postoperative/anesthesia complications. Written informed consent was obtained.    FINDINGS: Normal appearing female genitalia with multiparous normal appearing cervix.  9 weeks size uterus. Diffuse proliferative endometrium.  Normal ostia bilaterally. No obvious polyp noted.  ANESTHESIA:   General FLUID DEFICITS:  of normal saline ESTIMATED BLOOD LOSS:  20 ml URINE OUTPUT: unmeasured SPECIMENS: endometrial curettings sent to pathology COMPLICATIONS:  None immediate.  PROCEDURE DETAILS:  The patient was seen in the pre-op area where the plan was reviewed and she again verbalized understanding and consent.  She was then taken to the operating room where general anesthesia was administered.  After an adequate timeout was performed, she was placed in the dorsal lithotomy position and examined; then prepped and draped in the sterile manner. A straight catheter was inserted into the bladder sterilely and the bladder was drained.  A weighted speculum was then placed in the patient's vagina and a Sims retractor used to visualize the cervix. A single toothed tenaculum was applied to the anterior lip of the cervix.  The cervix then  dilated manually with metal dilators to accommodate the 6 mm operative hysteroscope.  Once the cervix was dilated, the hysteroscope was inserted under direct visualization using normal saline as a suspension medium.  The uterine cavity was carefully examined with the findings as noted above. The operative channel was introduced into the cavity and the endometrium was sampled in all areas, resecting the fluffy endometrial tissue using the myosure lite device under direct visualization. Once the fluffy endometrium was removed, the cavity was again viewed and noted to be free of other pathology. The hysteroscope was then removed under direct visualization.  A sharp curettage was then performed to obtain a moderate amount of endometrial curettings. The tenaculum was removed from the anterior lip of the cervix with good hemostasis noted at the tenaculum site. All instruments were remvoed from the vagina.  The patient tolerated the procedure well and was taken to the recovery area awake, extubated and in stable condition.  The patient will be discharged to home as per PACU criteria.  Routine postoperative instructions given.  She was prescribed oxycodone & Ibuprofen.  She will follow up in the clinic in 2 weeks  for postoperative evaluation.    Baldemar Lenis, M.D. Attending Obstetrician & Gynecologist, [facility] for [employer], [facility]

## 2019-11-15 NOTE — Anesthesia Postprocedure Evaluation (Signed)
Anesthesia Post Note  Patient: April Bolton  Procedure(s) Performed: DILATATION & CURETTAGE/HYSTEROSCOPY WITH MYOSURE (N/A Uterus)     Patient location during evaluation: PACU Anesthesia Type: General Level of consciousness: awake and alert Pain management: pain level controlled Vital Signs Assessment: post-procedure vital signs reviewed and stable Respiratory status: spontaneous breathing, nonlabored ventilation, respiratory function stable and patient connected to nasal cannula oxygen Cardiovascular status: blood pressure returned to baseline and stable Postop Assessment: no apparent nausea or vomiting Anesthetic complications: no   No complications documented.  Last Vitals:  Vitals:   11/15/19 1658 11/15/19 1700  BP:  105/78  Pulse: (!) 56 61  Resp: 11 18  Temp:    SpO2: 95% 98%    Last Pain:  Vitals:   11/15/19 1658  TempSrc:   PainSc: 1                  Pratham Cassatt

## 2019-11-15 NOTE — Transfer of Care (Signed)
Immediate Anesthesia Transfer of Care Note  Patient: April Bolton  Procedure(s) Performed: DILATATION & CURETTAGE/HYSTEROSCOPY WITH MYOSURE (N/A )  Patient Location: PACU  Anesthesia Type:General  Level of Consciousness: drowsy, patient cooperative and responds to stimulation  Airway & Oxygen Therapy: Patient Spontanous Breathing and Patient connected to face mask oxygen  Post-op Assessment: Report given to RN and Post -op Vital signs reviewed and stable  Post vital signs: Reviewed and stable  Last Vitals:  Vitals Value Taken Time  BP 122/78 11/15/19 1609  Temp    Pulse 80 11/15/19 1609  Resp 10 11/15/19 1609  SpO2 100 % 11/15/19 1609  Vitals shown include unvalidated device data.  Last Pain:  Vitals:   11/15/19 1239  TempSrc: Oral  PainSc: 0-No pain         Complications: No complications documented.

## 2019-11-16 ENCOUNTER — Encounter (HOSPITAL_BASED_OUTPATIENT_CLINIC_OR_DEPARTMENT_OTHER): Payer: Self-pay | Admitting: Obstetrics and Gynecology

## 2019-11-16 LAB — SURGICAL PATHOLOGY

## 2019-11-23 ENCOUNTER — Encounter: Payer: Self-pay | Admitting: *Deleted

## 2019-12-07 ENCOUNTER — Ambulatory Visit (INDEPENDENT_AMBULATORY_CARE_PROVIDER_SITE_OTHER): Payer: No Typology Code available for payment source | Admitting: Obstetrics and Gynecology

## 2019-12-07 ENCOUNTER — Encounter: Payer: Self-pay | Admitting: Obstetrics and Gynecology

## 2019-12-07 ENCOUNTER — Other Ambulatory Visit: Payer: Self-pay

## 2019-12-07 VITALS — BP 108/70 | HR 66 | Resp 16 | Ht 62.0 in | Wt 173.0 lb

## 2019-12-07 DIAGNOSIS — R102 Pelvic and perineal pain unspecified side: Secondary | ICD-10-CM

## 2019-12-07 DIAGNOSIS — N809 Endometriosis, unspecified: Secondary | ICD-10-CM | POA: Diagnosis not present

## 2019-12-07 DIAGNOSIS — N939 Abnormal uterine and vaginal bleeding, unspecified: Secondary | ICD-10-CM | POA: Diagnosis not present

## 2019-12-07 NOTE — Progress Notes (Signed)
   GYNECOLOGY OFFICE FOLLOW UP NOTE  History:  34 y.o. [id number] here today for follow up for D&C hysteroscopy for endometrial polyp and abnormal uterine bleeding on 11/15/19. No polyp noted in cavity or on path report.   Pt feeling well, took about a week to really feel back to normal, had some discomfort and stomach pain but now feeling back to normal. No issues with eating/using the rest room. No pain and denies bleeding. Overall, happy with how she is feeling.    Past Medical History:  Diagnosis Date  . Complication of anesthesia    slow to wake up  . Endometriosis   . Ovarian cyst   . Pregnancy induced hypertension 2011  . Preterm labor     Past Surgical History:  Procedure Laterality Date  . APPENDECTOMY  2005  . brachial cleft cyst  2006  . DILATATION & CURETTAGE/HYSTEROSCOPY WITH MYOSURE N/A 11/15/2019   Procedure: DILATATION & CURETTAGE/HYSTEROSCOPY WITH MYOSURE;  Surgeon: Conan Bowens, MD;  Location: [facility];  Service: Gynecology;  Laterality: N/A;  . HERNIA REPAIR  2005   inguinal  . LAPAROSCOPY  2008  . WISDOM TOOTH EXTRACTION  2006     Current Outpatient Medications:  .  Elagolix Sodium (ORILISSA) 150 MG TABS, Take 1 tablet by mouth daily., Disp: 30 tablet, Rfl: 11 .  Multiple Vitamin (MULTIVITAMIN) tablet, Take 1 tablet by mouth daily., Disp: , Rfl:   The following portions of the patient's history were reviewed and updated as appropriate: allergies, current medications, past family history, past medical history, past social history, past surgical history and problem list.   Review of Systems:  Pertinent items noted in HPI and remainder of comprehensive ROS otherwise negative.   Objective:  Physical Exam BP 108/70   Pulse 66   Resp 16   Ht 5\' 2"  (1.575 m)   Wt 173 lb (78.5 kg)   LMP 10/24/2019   BMI 31.64 kg/m  CONSTITUTIONAL: Well-developed, well-nourished female in no acute distress.  HENT:  Normocephalic, atraumatic. External right  and left ear normal. Oropharynx is clear and moist EYES: Conjunctivae and EOM are normal. Pupils are equal, round, and reactive to light. No scleral icterus.  NECK: Normal range of motion, supple, no masses SKIN: Skin is warm and dry. No rash noted. Not diaphoretic. No erythema. No pallor. NEUROLOGIC: Alert and oriented to person, place, and time. Normal reflexes, muscle tone coordination. No cranial nerve deficit noted. PSYCHIATRIC: Normal mood and affect. Normal behavior. Normal judgment and thought content. CARDIOVASCULAR: Normal heart rate noted RESPIRATORY: Effort normal, no problems with respiration noted ABDOMEN: Soft, no distention noted.   PELVIC: deferred MUSCULOSKELETAL: Normal range of motion. No edema noted.  Labs and Imaging No results found.  Assessment & Plan:   1. Abnormal uterine bleeding (AUB) None since surgery Reviewed path with no polyp noted during surgery, may have been disrupted with EMB  2. Endometriosis Cont orilissa, started 12/2018, has another year Call if pain returns  3. Pelvic pain See above   Routine preventative health maintenance measures emphasized. Please refer to After Visit Summary for other counseling recommendations.   Return in about 6 months (around 06/06/2020) for Followup.  Total face-to-face time with patient: 15 minutes. Over 50% of encounter was spent on counseling and coordination of care.  06/08/2020, M.D. Attending Center for Baldemar Lenis [employer])

## 2019-12-25 ENCOUNTER — Other Ambulatory Visit: Payer: Self-pay

## 2019-12-25 ENCOUNTER — Ambulatory Visit (INDEPENDENT_AMBULATORY_CARE_PROVIDER_SITE_OTHER): Payer: No Typology Code available for payment source | Admitting: Obstetrics and Gynecology

## 2019-12-25 ENCOUNTER — Encounter: Payer: Self-pay | Admitting: Obstetrics and Gynecology

## 2019-12-25 VITALS — BP 112/70 | HR 70 | Resp 16 | Ht 62.0 in | Wt 168.0 lb

## 2019-12-25 DIAGNOSIS — N939 Abnormal uterine and vaginal bleeding, unspecified: Secondary | ICD-10-CM | POA: Diagnosis not present

## 2019-12-25 DIAGNOSIS — N809 Endometriosis, unspecified: Secondary | ICD-10-CM

## 2019-12-25 DIAGNOSIS — R635 Abnormal weight gain: Secondary | ICD-10-CM | POA: Diagnosis not present

## 2019-12-25 MED ORDER — NORETHINDRONE ACETATE 5 MG PO TABS: 5.0000 mg | ORAL_TABLET | Freq: Every day | ORAL | 2 refills | Status: DC

## 2019-12-25 NOTE — Progress Notes (Signed)
GYNECOLOGY OFFICE FOLLOW UP NOTE  History:  34 y.o. [id number] here today for follow up for pelvic pain and heavy bleeding. Had another episode of endometriosis pain about 2 weeks ago, with heavy bleeding. Lasted 10 days, was soaking through tampons in < 1 hr. Pain was worse than any other episode she has had this year, has had 5 of these episodes in 6 months. This one was worse than others and felt just like periods she was having prior to starting [place]. No other issues. Otherwise recovered well from surgery.    Past Medical History:  Diagnosis Date  . Complication of anesthesia    slow to wake up  . Endometriosis   . Ovarian cyst   . Pregnancy induced hypertension 2011  . Preterm labor     Past Surgical History:  Procedure Laterality Date  . APPENDECTOMY  2005  . brachial cleft cyst  2006  . DILATATION & CURETTAGE/HYSTEROSCOPY WITH MYOSURE N/A 11/15/2019   Procedure: DILATATION & CURETTAGE/HYSTEROSCOPY WITH MYOSURE;  Surgeon: Conan Bowens, MD;  Location: [facility];  Service: Gynecology;  Laterality: N/A;  . HERNIA REPAIR  2005   inguinal  . LAPAROSCOPY  2008  . WISDOM TOOTH EXTRACTION  2006     Current Outpatient Medications:  Marland Kitchen  Multiple Vitamin (MULTIVITAMIN) tablet, Take 1 tablet by mouth daily., Disp: , Rfl:  .  norethindrone (AYGESTIN) 5 MG tablet, Take 1 tablet (5 mg total) by mouth daily., Disp: 30 tablet, Rfl: 2  The following portions of the patient's history were reviewed and updated as appropriate: allergies, current medications, past family history, past medical history, past social history, past surgical history and problem list.   Review of Systems:  Pertinent items noted in HPI and remainder of comprehensive ROS otherwise negative.   Objective:  Physical Exam BP 112/70   Pulse 70   Resp 16   Ht 5\' 2"  (1.575 m)   Wt 168 lb (76.2 kg)   LMP 12/14/2019   BMI 30.73 kg/m  CONSTITUTIONAL: Well-developed, well-nourished female in no acute  distress.  HENT:  Normocephalic, atraumatic. External right and left ear normal. Oropharynx is clear and moist EYES: Conjunctivae and EOM are normal. Pupils are equal, round, and reactive to light. No scleral icterus.  NECK: Normal range of motion, supple, no masses SKIN: Skin is warm and dry. No rash noted. Not diaphoretic. No erythema. No pallor. NEUROLOGIC: Alert and oriented to person, place, and time. Normal reflexes, muscle tone coordination. No cranial nerve deficit noted. PSYCHIATRIC: Normal mood and affect. Normal behavior. Normal judgment and thought content. CARDIOVASCULAR: Normal heart rate noted RESPIRATORY: Effort normal, no problems with respiration noted ABDOMEN: Soft, no distention noted.   PELVIC: deferred MUSCULOSKELETAL: Normal range of motion. No edema noted.  Labs and Imaging No results found.  Assessment & Plan:  1. Endometriosis Pt did well for about 6 months on Orilissa 150 mg daily, then started having recurrence of pain and heavy bleeding. Dx with endometrial polyp and had D&C hysteroscopy, benign polyp on path but with ongoing episodes of pain/bleeding with periods - reviewed options for management including doubling Orilissa dose, adding on OCPs, switching to OCPs, switching to progestin, surgery. She has had Mirena with no success in past, did well on OCPs for a while and then had worsening pain and started 12/16/2019 - pt would like to start aygestin  2. Abnormal uterine bleeding (AUB) See above  3. Weight gain Pt has gained weight and very difficult time  losing, will check thyroid - TSH   Routine preventative health maintenance measures emphasized. Please refer to After Visit Summary for other counseling recommendations.   Return in about 3 months (around 03/26/2020) for in person, Followup.  Total face-to-face time with patient: 25 minutes. Over 50% of encounter was spent on counseling and coordination of care.  Baldemar Lenis, M.D. Attending Center  for [employer] [facility])

## 2019-12-26 LAB — TSH: TSH: 1.12 mIU/L

## 2020-02-13 ENCOUNTER — Telehealth: Payer: Self-pay | Admitting: *Deleted

## 2020-02-13 NOTE — Telephone Encounter (Signed)
Patient has an appointment in January for a second opinion. Will call the office to schedule F/U visit if needed.

## 2021-02-25 IMAGING — US US PELVIS COMPLETE WITH TRANSVAGINAL
1 series · 13 of 25 positions shown · non-contrast
Comparison: 12/16/2017

CLINICAL DATA: Pelvic pain for 1 week, LMP 07/13/2019, G2P2,
history of ovarian cysts and endometriosis



[Series 1: us pelvis complete with transvaginal · 0.24mm/px · 117 acquisitions, 13 frames shown]
[im 1/117]
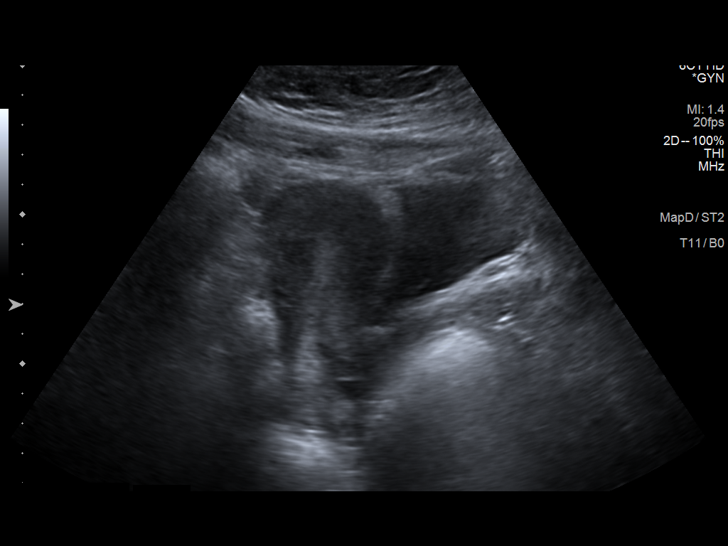
[im 10/117]
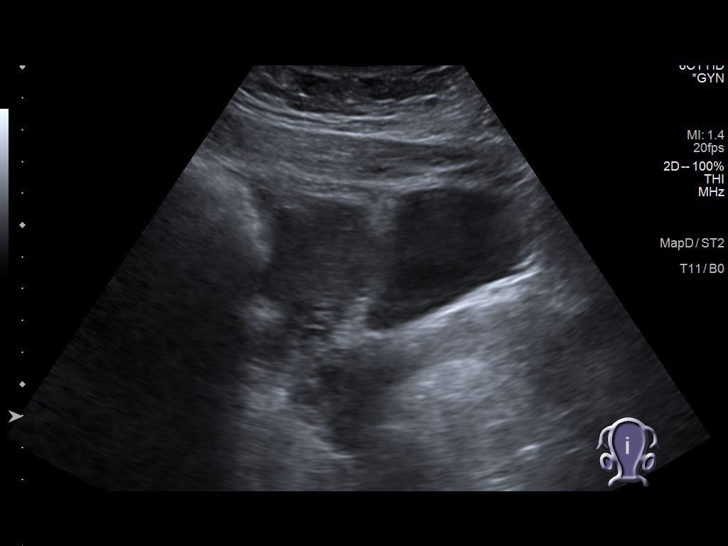
[im 20/117]
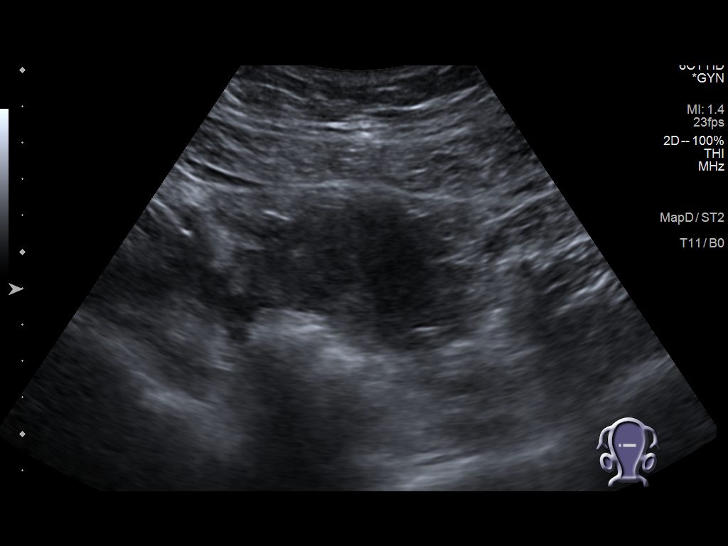
[im 30/117]
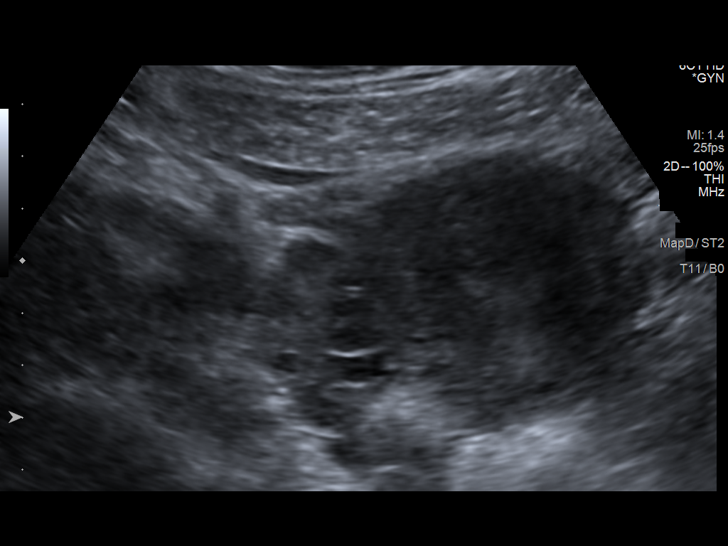
[im 39/117]
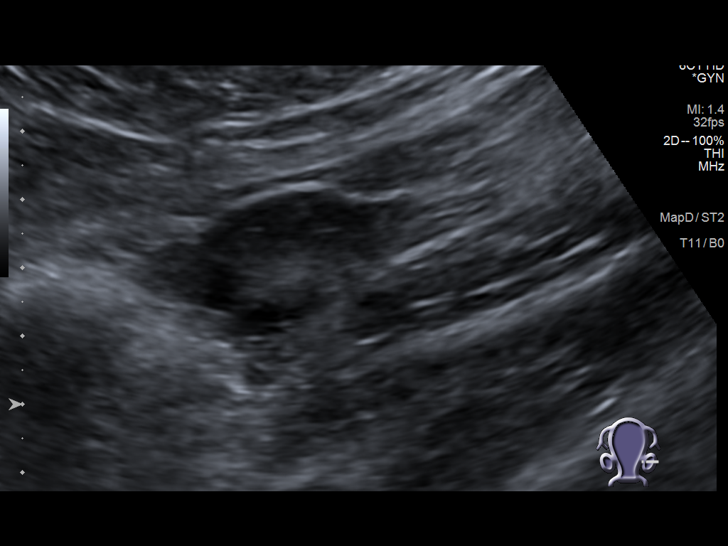
[im 49/117]
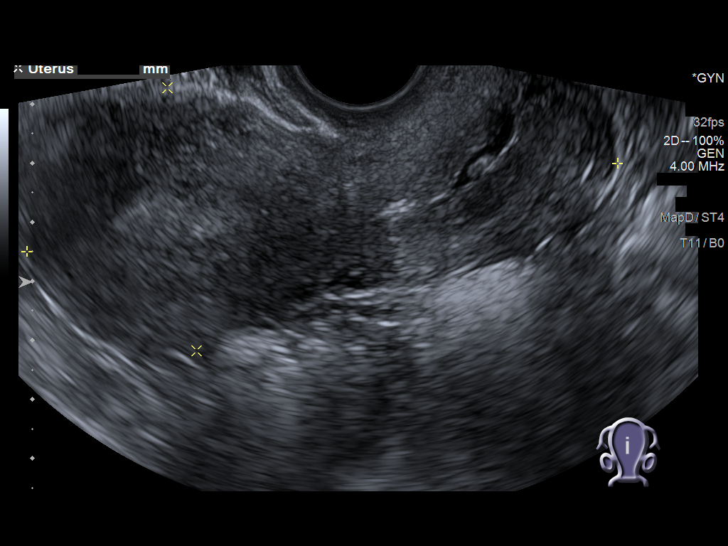
[im 59/117]
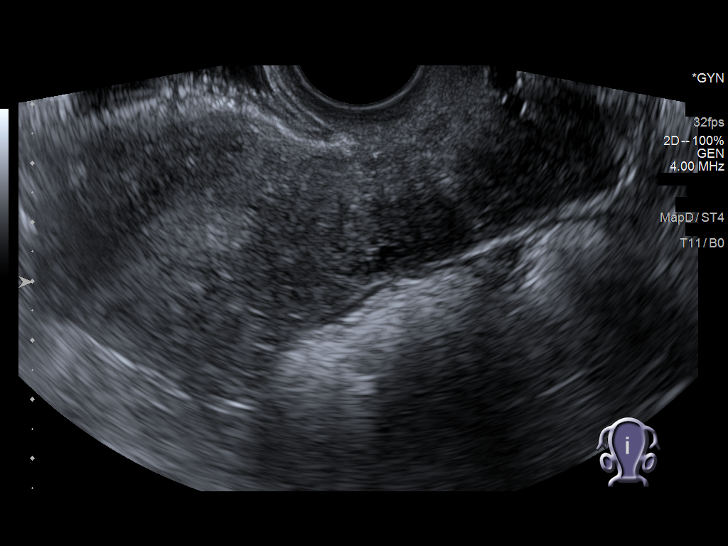
[im 68/117]
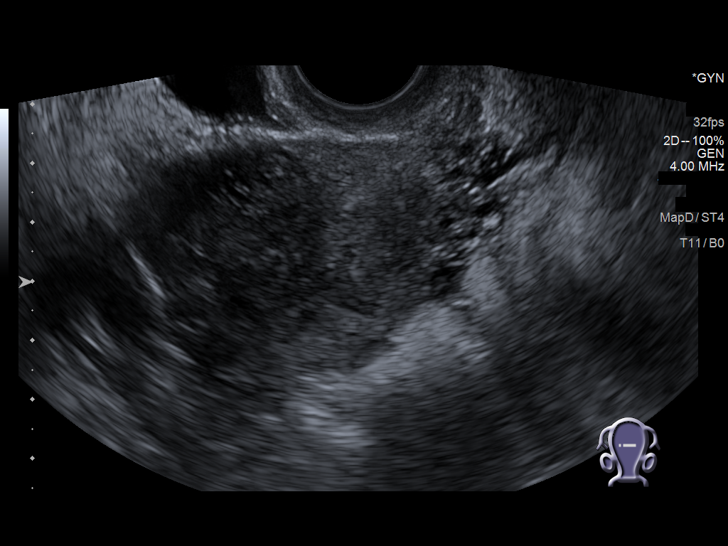
[im 78/117]
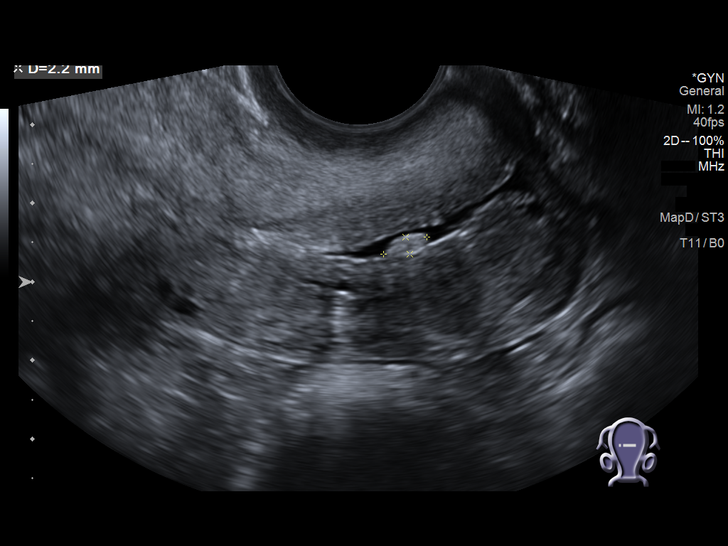
[im 88/117]
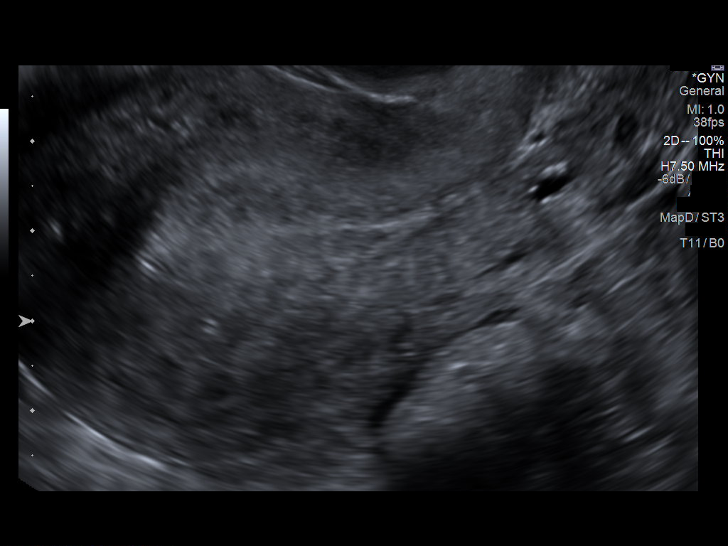
[im 97/117]
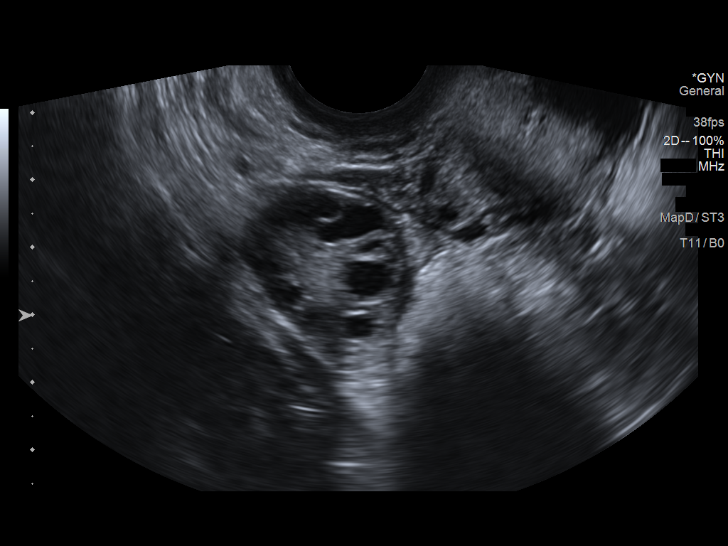
[im 107/117]
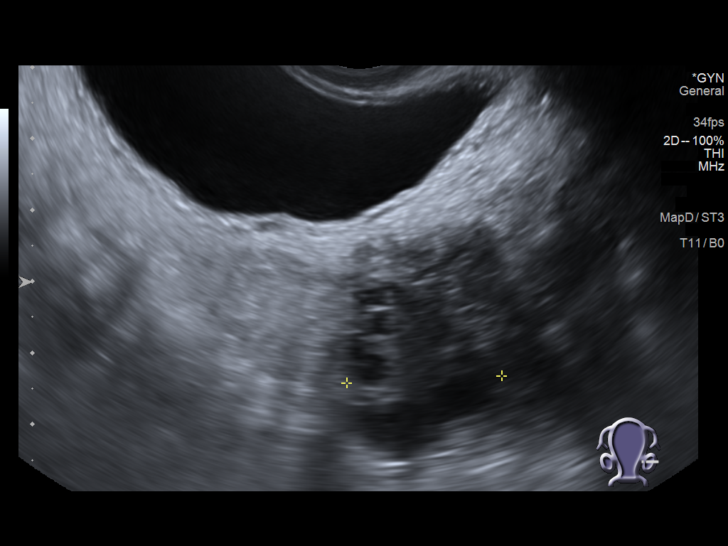
[im 117/117]
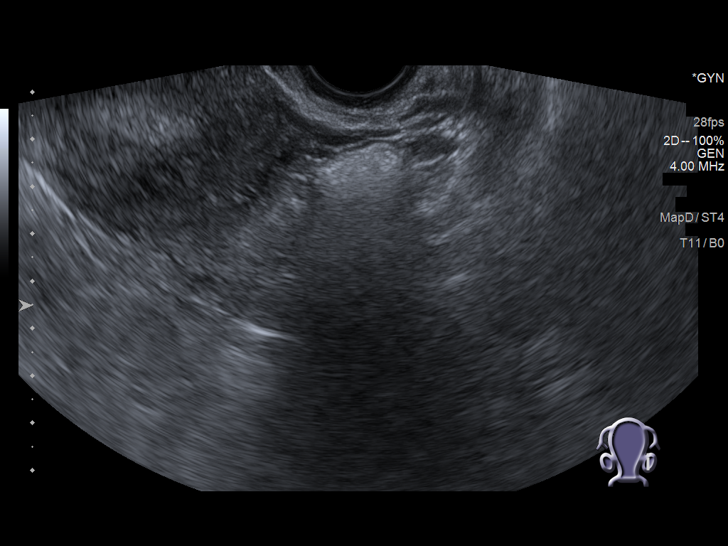

[13 of 25 positions shown; findings below may reference images not displayed]

FINDINGS: Uterus

Measurements: 9.0 x 4.7 x 5.2 cm = volume: 115 mL. Mildly
heterogeneous myometrium. No focal uterine mass. Incidentally noted
nabothian cysts at cervix.

Endometrium

Thickness: 17 mm. Mildly heterogeneous. No endometrial fluid. Small
amount of fluid within endocervical canal. Small endocervical polyp
6 x 2 x 3 mm.

Right ovary

Measurements: 4.2 x 2.5 x 2.5 cm = volume: 14 mL. Numerous follicles
without mass

Left ovary

Measurements: 2.7 x 2.3 x 2.2 cm = volume: 7 mL. Numerous follicles
without mass

Other findings

No free pelvic fluid.  No adnexal masses.
IMPRESSION: Upper normal thickness of endometrial complex without focal mass.

Small endocervical polyp 6 x 2 x 3 mm.

Uterus and endometrium otherwise unremarkable.

Numerous follicles in both ovaries, nonspecific but could reflect
polycystic ovaries, recommend clinical and laboratory correlation.

## 2022-04-02 ENCOUNTER — Ambulatory Visit
Admission: EM | Admit: 2022-04-02 | Discharge: 2022-04-02 | Disposition: A | Discharge status: Home / Self Care | Payer: No Typology Code available for payment source

## 2022-04-02 DIAGNOSIS — J36 Peritonsillar abscess: Secondary | ICD-10-CM | POA: Diagnosis not present

## 2022-04-02 LAB — POCT RAPID STREP A (OFFICE): Rapid Strep A Screen: NEGATIVE

## 2022-04-02 MED ORDER — AMOXICILLIN-POT CLAVULANATE 875-125 MG PO TABS: 1.0000 | ORAL_TABLET | Freq: Two times a day (BID) | ORAL | 0 refills | Status: AC

## 2022-04-02 NOTE — ED Triage Notes (Signed)
Pt presents to Urgent Care with c/o fatigue and sore throat since this AM. Son has strep throat.

## 2022-04-02 NOTE — ED Provider Notes (Signed)
[facility]    CSN: [id number] Arrival date & time: 04/02/22  1244      History   Chief Complaint Chief Complaint  Patient presents with   Sore Throat    HPI April Bolton is a 37 y.o. female.   Pleasant 37 year old female presents today with complaints of a sore throat that started abruptly this morning.  She was with her son at urgent care on Sunday the 11th, who tested positive for strep throat.  Patient states she has had some mild fatigue/body aches, but different than like the flu.  She denies a known fever.  She denies GI symptoms.  She reports that her tonsils are very swollen.  No rash.  No over-the-counter medications taken.   Sore Throat    Past Medical History:  Diagnosis Date   Complication of anesthesia    slow to wake up   Endometriosis    Ovarian cyst    Pregnancy induced hypertension 2011   Preterm labor     Patient Active Problem List   Diagnosis Date Noted   Vaginal mass 10/15/2014   Rubella non-immune status, antepartum 08/21/2013   Endometriosis 05/16/2013   History of vacuum extraction assisted delivery 05/16/2013    Past Surgical History:  Procedure Laterality Date   ABDOMINAL HYSTERECTOMY     APPENDECTOMY  2005   brachial cleft cyst  2006   DILATATION & CURETTAGE/HYSTEROSCOPY WITH MYOSURE N/A 11/15/2019   Procedure: DILATATION & CURETTAGE/HYSTEROSCOPY WITH MYOSURE;  Surgeon: Sloan Leiter, MD;  Location: [facility];  Service: Gynecology;  Laterality: N/A;   HERNIA REPAIR  2005   inguinal   LAPAROSCOPY  2008   WISDOM TOOTH EXTRACTION  2006    OB History     Gravida  2   Para  2   Term  2   Preterm      AB      Living  2      SAB      IAB      Ectopic      Multiple  0   Live Births  2            Home Medications    Prior to Admission medications   Medication Sig Start Date End Date Taking? Authorizing Provider  amoxicillin-clavulanate (AUGMENTIN) 875-125 MG tablet Take 1  tablet by mouth 2 (two) times daily with a meal for 14 days. 04/02/22 04/16/22 Yes Riyana Biel L, PA  busPIRone (BUSPAR) 10 MG tablet Take by mouth. 03/11/22  Yes [provider]  FLUoxetine (PROZAC) 10 MG capsule Take by mouth. 03/11/22  Yes [provider]  Multiple Vitamin (MULTIVITAMIN) tablet Take 1 tablet by mouth daily.    [provider]    Family History Family History  Problem Relation Age of Onset   Cancer Mother        cervical   Diabetes Father    Heart disease Father    Hypertension Father     Social History Social History   Tobacco Use   Smoking status: Former    Packs/day: 0.30    Years: 4.00    Total pack years: 1.20    Types: Cigarettes    Quit date: 12/26/2008    Years since quitting: 13.2   Smokeless tobacco: Never  Vaping Use   Vaping Use: Never used  Substance Use Topics   Alcohol use: Yes    Comment: rarely   Drug use: No  Allergies   Brethine [terbutaline] and Sulfa antibiotics   Review of Systems Review of Systems  Constitutional:  Positive for fatigue.  HENT:  Positive for sore throat.   All other systems reviewed and are negative.    Physical Exam Triage Vital Signs ED Triage Vitals  Enc Vitals Group     BP 04/02/22 1259 112/76     Pulse Rate 04/02/22 1259 88     Resp 04/02/22 1259 18     Temp 04/02/22 1259 98 F (36.7 C)     Temp Source 04/02/22 1259 Oral     SpO2 04/02/22 1259 95 %     Weight 04/02/22 1255 170 lb (77.1 kg)     Height 04/02/22 1255 5' 2"$  (1.575 m)     Head Circumference --      Peak Flow --      Pain Score 04/02/22 1254 4     Pain Loc --      Pain Edu? --      Excl. in [place]? --    No data found.  Updated Vital Signs BP 112/76 (BP Location: Right Arm)   Pulse 88   Temp 98 F (36.7 C) (Oral)   Resp 18   Ht 5' 2"$  (1.575 m)   Wt 170 lb (77.1 kg)   LMP 12/14/2019   SpO2 95%   BMI 31.09 kg/m   Visual Acuity Right Eye Distance:   Left Eye Distance:   Bilateral  Distance:    Right Eye Near:   Left Eye Near:    Bilateral Near:     Physical Exam Vitals and nursing note reviewed.  Constitutional:      General: She is not in acute distress.    Appearance: She is well-developed and normal weight. She is not ill-appearing, toxic-appearing or diaphoretic.  HENT:     Head: Normocephalic and atraumatic.     Right Ear: Tympanic membrane and ear canal normal. No drainage, swelling or tenderness. No middle ear effusion. Tympanic membrane is not erythematous.     Left Ear: Tympanic membrane and ear canal normal. No drainage, swelling or tenderness.  No middle ear effusion. Tympanic membrane is not erythematous.     Nose: No congestion or rhinorrhea.     Mouth/Throat:     Mouth: Mucous membranes are moist.     Pharynx: Uvula midline. Oropharyngeal exudate and posterior oropharyngeal erythema present. No pharyngeal swelling or uvula swelling.     Tonsils: Tonsillar exudate present. No tonsillar abscesses. 0 on the right. 2+ on the left.     Comments: Concerning for peritonsillar cellulitis on L; no appreciable abscess Eyes:     Conjunctiva/sclera: Conjunctivae normal.     Pupils: Pupils are equal, round, and reactive to light.  Neck:     Thyroid: No thyromegaly.  Cardiovascular:     Rate and Rhythm: Normal rate and regular rhythm.     Heart sounds: Normal heart sounds. No murmur heard.    No friction rub. No gallop.  Pulmonary:     Effort: Pulmonary effort is normal. No respiratory distress.     Breath sounds: Normal breath sounds. No stridor. No wheezing, rhonchi or rales.  Chest:     Chest wall: No tenderness.  Abdominal:     General: There is no distension.     Palpations: Abdomen is soft.     Tenderness: There is no abdominal tenderness. There is no rebound.  Musculoskeletal:     Cervical back: Normal range of motion  and neck supple.  Lymphadenopathy:     Cervical: No cervical adenopathy.  Skin:    General: Skin is warm.     Capillary  Refill: Capillary refill takes less than 2 seconds.     Findings: No rash.  Neurological:     General: No focal deficit present.     Mental Status: She is alert and oriented to person, place, and time.  Psychiatric:        Mood and Affect: Mood normal.      UC Treatments / Results  Labs (all labs ordered are listed, but only abnormal results are displayed) Labs Reviewed  POCT RAPID STREP A (OFFICE)    EKG   Radiology No results found.  Procedures Procedures (including critical care time)  Medications Ordered in UC Medications - No data to display  Initial Impression / Assessment and Plan / UC Course  I have reviewed the triage vital signs and the nursing notes.  Pertinent labs & imaging results that were available during my care of the patient were reviewed by me and considered in my medical decision making (see chart for details).     Peritonsillar cellulitis -patient with known exposure to strep.  In office strep negative, but given appearance of her throat I am actually concerned about a developing mild peritonsillar cellulitis.  I do not see evidence of a peritonsillar abscess.  Patient tolerating secretions and vital signs stable.  Will start aggressively with Augmentin twice daily for 14 days.  Return to clinic/ER precautions discussed.   Final Clinical Impressions(s) / UC Diagnoses   Final diagnoses:  Peritonsillar cellulitis     Discharge Instructions      Your strep test is negative. Given your exposure to positive strep and appearance of your throat, it is imperative that we cover you for developing peritonsillar cellulitis which is an infection of the soft tissues in your mouth.  Take the Augmentin twice daily with food. Take for all 14 days.  Take an OTC probiotic to help prevent any adverse GI side effects.  Throw away your toothbrush after completing three full days of medications.  If you develop a high fever, difficulty swallowing or any  worsening symptoms, please return for recheck right away.   ED Prescriptions     Medication Sig Dispense Auth. Provider   amoxicillin-clavulanate (AUGMENTIN) 875-125 MG tablet Take 1 tablet by mouth 2 (two) times daily with a meal for 14 days. 28 tablet Titilayo Hagans L, [place]      PDMP not reviewed this encounter.   Chaney Malling, [place] 04/02/22 2135

## 2022-04-02 NOTE — Discharge Instructions (Addendum)
Your strep test is negative. Given your exposure to positive strep and appearance of your throat, it is imperative that we cover you for developing peritonsillar cellulitis which is an infection of the soft tissues in your mouth.  Take the Augmentin twice daily with food. Take for all 14 days.  Take an OTC probiotic to help prevent any adverse GI side effects.  Throw away your toothbrush after completing three full days of medications.  If you develop a high fever, difficulty swallowing or any worsening symptoms, please return for recheck right away.

## 2022-09-06 ENCOUNTER — Ambulatory Visit (INDEPENDENT_AMBULATORY_CARE_PROVIDER_SITE_OTHER): Payer: No Typology Code available for payment source

## 2022-09-06 ENCOUNTER — Encounter: Payer: Self-pay | Admitting: Emergency Medicine

## 2022-09-06 ENCOUNTER — Ambulatory Visit
Admission: EM | Admit: 2022-09-06 | Discharge: 2022-09-06 | Disposition: A | Discharge status: Home / Self Care | Payer: No Typology Code available for payment source | Attending: Family Medicine | Admitting: Family Medicine

## 2022-09-06 DIAGNOSIS — S93492A Sprain of other ligament of left ankle, initial encounter: Secondary | ICD-10-CM | POA: Diagnosis not present

## 2022-09-06 DIAGNOSIS — S99912A Unspecified injury of left ankle, initial encounter: Secondary | ICD-10-CM

## 2022-09-06 NOTE — ED Triage Notes (Signed)
Patient states that she was in [place] and stepped off an area and rolled her left ankle.  This happened on 08/30/2022.  Patient has been wrapping her ankle, still swelling and bruising.

## 2022-09-06 NOTE — ED Provider Notes (Signed)
[facility]    CSN: [id number] Arrival date & time: 09/06/22  1136      History   Chief Complaint Chief Complaint  Patient presents with   Ankle Pain    HPI April Bolton is a 37 y.o. female.   Patient twisted her ankle while on vacation on 08/30/2022.  She stepped off a curb.  Her ankle is still swollen, discolored, and painful.  She is here to make sure she does not have a fracture.  She has used ice and elevation.  She is wearing a brace.  She is limiting her walking    Past Medical History:  Diagnosis Date   Complication of anesthesia    slow to wake up   Endometriosis    Ovarian cyst    Pregnancy induced hypertension 2011   Preterm labor     Patient Active Problem List   Diagnosis Date Noted   Vaginal mass 10/15/2014   Rubella non-immune status, antepartum 08/21/2013   Endometriosis 05/16/2013   History of vacuum extraction assisted delivery 05/16/2013    Past Surgical History:  Procedure Laterality Date   ABDOMINAL HYSTERECTOMY     APPENDECTOMY  2005   brachial cleft cyst  2006   DILATATION & CURETTAGE/HYSTEROSCOPY WITH MYOSURE N/A 11/15/2019   Procedure: DILATATION & CURETTAGE/HYSTEROSCOPY WITH MYOSURE;  Surgeon: Conan Bowens, MD;  Location: [facility];  Service: Gynecology;  Laterality: N/A;   HERNIA REPAIR  2005   inguinal   LAPAROSCOPY  2008   WISDOM TOOTH EXTRACTION  2006    OB History     Gravida  2   Para  2   Term  2   Preterm      AB      Living  2      SAB      IAB      Ectopic      Multiple  0   Live Births  2            Home Medications    Prior to Admission medications   Medication Sig Start Date End Date Taking? Authorizing Provider  busPIRone (BUSPAR) 10 MG tablet Take by mouth. 03/11/22  Yes [provider]  FLUoxetine (PROZAC) 10 MG capsule Take by mouth. 03/11/22  Yes [provider]  Multiple Vitamin (MULTIVITAMIN) tablet Take 1 tablet by mouth daily.    Yes [provider]    Family History Family History  Problem Relation Age of Onset   Cancer Mother        cervical   Diabetes Father    Heart disease Father    Hypertension Father     Social History Social History   Tobacco Use   Smoking status: Former    Current packs/day: 0.00    Average packs/day: 0.3 packs/day for 4.0 years (1.2 ttl pk-yrs)    Types: Cigarettes    Start date: 12/26/2004    Quit date: 12/26/2008    Years since quitting: 13.7   Smokeless tobacco: Never  Vaping Use   Vaping status: Never Used  Substance Use Topics   Alcohol use: Yes    Comment: rarely   Drug use: No     Allergies   Brethine [terbutaline] and Sulfa antibiotics   Review of Systems Review of Systems  See HPI Physical Exam Triage Vital Signs ED Triage Vitals  Encounter Vitals Group     BP 09/06/22 1150 128/84     Systolic BP Percentile --  Diastolic BP Percentile --      Pulse Rate 09/06/22 1150 74     Resp 09/06/22 1150 16     Temp 09/06/22 1150 98.3 F (36.8 C)     Temp Source 09/06/22 1150 Oral     SpO2 09/06/22 1150 97 %     Weight 09/06/22 1200 175 lb (79.4 kg)     Height 09/06/22 1200 5\' 2"  (1.575 m)     Head Circumference --      Peak Flow --      Pain Score 09/06/22 1159 4     Pain Loc --      Pain Education --      Exclude from Growth Chart --    No data found.  Updated Vital Signs BP 128/84 (BP Location: Left Arm)   Pulse 74   Temp 98.3 F (36.8 C) (Oral)   Resp 16   Ht 5\' 2"  (1.575 m)   Wt 79.4 kg   LMP 12/14/2019   SpO2 97%   BMI 32.01 kg/m      Physical Exam Constitutional:      General: She is not in acute distress.    Appearance: She is well-developed.  HENT:     Head: Normocephalic and atraumatic.  Eyes:     Conjunctiva/sclera: Conjunctivae normal.     Pupils: Pupils are equal, round, and reactive to light.  Cardiovascular:     Rate and Rhythm: Normal rate.  Pulmonary:     Effort: Pulmonary effort is normal. No  respiratory distress.  Abdominal:     General: There is no distension.     Palpations: Abdomen is soft.  Musculoskeletal:        General: Swelling, tenderness and signs of injury present. Normal range of motion.     Cervical back: Normal range of motion.     Comments: The left ankle has resolving ecchymosis from the distal fibula around the lateral foot almost to the toes.  There is swelling over the lateral malleolus.  Tenderness over the tip of the malleolus.  Full range of motion.  No instability  Skin:    General: Skin is warm and dry.  Neurological:     Mental Status: She is alert.     Gait: Gait abnormal.      UC Treatments / Results  Labs (all labs ordered are listed, but only abnormal results are displayed) Labs Reviewed - No data to display  EKG   Radiology DG Ankle Complete Left  Result Date: 09/06/2022 CLINICAL DATA:  Left ankle injury. Pain with movement and weight-bearing. Lateral swelling and bruising. EXAM: LEFT ANKLE COMPLETE - 3+ VIEW COMPARISON:  None Available. FINDINGS: No acute fracture or dislocation is identified. Joint spaces are preserved. Mild soft tissue swelling is noted along the lateral aspect of the ankle. IMPRESSION: Soft tissue swelling without acute fracture identified. Electronically Signed   By: Sebastian Ache M.D.   On: 09/06/2022 12:10    Procedures Procedures (including critical care time)  Medications Ordered in UC Medications - No data to display  Initial Impression / Assessment and Plan / UC Course  I have reviewed the triage vital signs and the nursing notes.  Pertinent labs & imaging results that were available during my care of the patient were reviewed by me and considered in my medical decision making (see chart for details).     Final Clinical Impressions(s) / UC Diagnoses   Final diagnoses:  Sprain of anterior talofibular ligament of left  ankle, initial encounter     Discharge Instructions      OTC advil or aleve for  pain Elevate to reduce swelling Keep wrapped for comfort See ortho if you fail to improve    ED Prescriptions   None    PDMP not reviewed this encounter.   Eustace Moore, MD 09/06/22 [phone]

## 2022-09-06 NOTE — Discharge Instructions (Signed)
OTC advil or aleve for pain Elevate to reduce swelling Keep wrapped for comfort See ortho if you fail to improve
